# Patient Record
Sex: Female | Born: 1945 | Race: White | Hispanic: No | Marital: Married | Smoking: Never smoker
Health system: Southern US, Community
[De-identification: ages and names within clinical notes are randomized; demographics above are authoritative.]

## PROBLEM LIST (undated history)

## (undated) DIAGNOSIS — I10 Essential (primary) hypertension: Secondary | ICD-10-CM

## (undated) DIAGNOSIS — F410 Panic disorder [episodic paroxysmal anxiety] without agoraphobia: Secondary | ICD-10-CM

---

## 2007-12-02 ENCOUNTER — Encounter: Admission: RE | Admit: 2007-12-02 | Discharge: 2007-12-02 | Payer: Self-pay | Admitting: Family Medicine

## 2008-02-17 ENCOUNTER — Encounter: Admission: RE | Admit: 2008-02-17 | Discharge: 2008-02-17 | Payer: Self-pay | Admitting: Family Medicine

## 2009-05-30 ENCOUNTER — Encounter: Admission: RE | Admit: 2009-05-30 | Discharge: 2009-05-30 | Payer: Self-pay | Admitting: Family Medicine

## 2010-07-04 ENCOUNTER — Other Ambulatory Visit: Payer: Self-pay | Admitting: Family Medicine

## 2010-07-04 DIAGNOSIS — Z1231 Encounter for screening mammogram for malignant neoplasm of breast: Secondary | ICD-10-CM

## 2010-07-04 DIAGNOSIS — Z780 Asymptomatic menopausal state: Secondary | ICD-10-CM

## 2010-07-17 ENCOUNTER — Ambulatory Visit: Payer: Self-pay

## 2010-07-17 ENCOUNTER — Inpatient Hospital Stay: Admission: RE | Admit: 2010-07-17 | Payer: Self-pay | Source: Ambulatory Visit

## 2010-07-27 ENCOUNTER — Ambulatory Visit: Payer: Self-pay

## 2010-07-27 ENCOUNTER — Other Ambulatory Visit: Payer: Self-pay

## 2010-09-12 ENCOUNTER — Ambulatory Visit
Admission: RE | Admit: 2010-09-12 | Discharge: 2010-09-12 | Disposition: A | Discharge status: Home / Self Care | Payer: Medicare Other | Source: Ambulatory Visit | Attending: Family Medicine | Admitting: Family Medicine

## 2010-09-12 DIAGNOSIS — Z780 Asymptomatic menopausal state: Secondary | ICD-10-CM

## 2010-09-12 DIAGNOSIS — Z1231 Encounter for screening mammogram for malignant neoplasm of breast: Secondary | ICD-10-CM

## 2011-10-19 ENCOUNTER — Other Ambulatory Visit: Payer: Self-pay | Admitting: Physician Assistant

## 2011-10-19 DIAGNOSIS — Z1231 Encounter for screening mammogram for malignant neoplasm of breast: Secondary | ICD-10-CM

## 2011-11-16 ENCOUNTER — Ambulatory Visit
Admission: RE | Admit: 2011-11-16 | Discharge: 2011-11-16 | Disposition: A | Discharge status: Home / Self Care | Payer: Medicare Other | Source: Ambulatory Visit | Attending: Physician Assistant | Admitting: Physician Assistant

## 2011-11-16 DIAGNOSIS — Z1231 Encounter for screening mammogram for malignant neoplasm of breast: Secondary | ICD-10-CM

## 2012-10-29 ENCOUNTER — Other Ambulatory Visit: Payer: Self-pay | Admitting: Family Medicine

## 2012-10-29 DIAGNOSIS — Z1231 Encounter for screening mammogram for malignant neoplasm of breast: Secondary | ICD-10-CM

## 2012-10-29 DIAGNOSIS — M810 Age-related osteoporosis without current pathological fracture: Secondary | ICD-10-CM

## 2012-12-09 ENCOUNTER — Ambulatory Visit
Admission: RE | Admit: 2012-12-09 | Discharge: 2012-12-09 | Disposition: A | Discharge status: Home / Self Care | Payer: Medicare Other | Source: Ambulatory Visit | Attending: Family Medicine | Admitting: Family Medicine

## 2012-12-09 DIAGNOSIS — M810 Age-related osteoporosis without current pathological fracture: Secondary | ICD-10-CM

## 2012-12-09 DIAGNOSIS — Z1231 Encounter for screening mammogram for malignant neoplasm of breast: Secondary | ICD-10-CM

## 2013-07-12 ENCOUNTER — Emergency Department (HOSPITAL_COMMUNITY)
Admission: EM | Admit: 2013-07-12 | Discharge: 2013-07-12 | Disposition: A | Discharge status: Home / Self Care | Payer: Medicare HMO | Attending: Emergency Medicine | Admitting: Emergency Medicine

## 2013-07-12 ENCOUNTER — Encounter (HOSPITAL_COMMUNITY): Payer: Self-pay | Admitting: Emergency Medicine

## 2013-07-12 DIAGNOSIS — R42 Dizziness and giddiness: Secondary | ICD-10-CM | POA: Insufficient documentation

## 2013-07-12 DIAGNOSIS — I10 Essential (primary) hypertension: Secondary | ICD-10-CM | POA: Insufficient documentation

## 2013-07-12 DIAGNOSIS — Z8659 Personal history of other mental and behavioral disorders: Secondary | ICD-10-CM | POA: Insufficient documentation

## 2013-07-12 DIAGNOSIS — R55 Syncope and collapse: Secondary | ICD-10-CM

## 2013-07-12 HISTORY — DX: Panic disorder (episodic paroxysmal anxiety): F41.0

## 2013-07-12 HISTORY — DX: Essential (primary) hypertension: I10

## 2013-07-12 LAB — BASIC METABOLIC PANEL
BUN: 13 mg/dL (ref 6–23)
CHLORIDE: 101 meq/L (ref 96–112)
CO2: 25 mEq/L (ref 19–32)
Calcium: 9.3 mg/dL (ref 8.4–10.5)
Creatinine, Ser: 0.64 mg/dL (ref 0.50–1.10)
GFR calc Af Amer: >90 mL/min (ref >90)
GFR calc non Af Amer: 90 mL/min — ABNORMAL LOW (ref >90)
GLUCOSE: 136 mg/dL — AB (ref 70–99)
POTASSIUM: 3.6 meq/L — AB (ref 3.7–5.3)
SODIUM: 140 meq/L (ref 137–147)

## 2013-07-12 LAB — CBC
HCT: 36.3 % (ref 36.0–46.0)
Hemoglobin: 12.4 g/dL (ref 12.0–15.0)
MCH: 31.5 pg (ref 26.0–34.0)
MCHC: 34.2 g/dL (ref 30.0–36.0)
MCV: 92.1 fL (ref 78.0–100.0)
Platelets: 235 10*3/uL (ref 150–400)
RBC: 3.94 MIL/uL (ref 3.87–5.11)
RDW: 12.5 % (ref 11.5–15.5)
WBC: 6.6 10*3/uL (ref 4.0–10.5)

## 2013-07-12 NOTE — ED Notes (Signed)
Pt was on nursing floor visiting family member get dressing changes. Became weak, sweaty and hypotensive during this time. Pt states BP was 67/49 when taken by floor nurse. Pt denies any pain during episode. Pt states that she is feeling better now.

## 2013-07-12 NOTE — Discharge Instructions (Signed)
Near-Syncope °Near-syncope (commonly known as near fainting) is sudden weakness, dizziness, or feeling like you might pass out. During an episode of near-syncope, you may also develop pale skin, have tunnel vision, or feel sick to your stomach (nauseous). Near-syncope may occur when getting up after sitting or while standing for a long time. It is caused by a sudden decrease in blood flow to the brain. This decrease can result from various causes or triggers, most of which are not serious. However, because near-syncope can sometimes be a sign of something serious, a medical evaluation is required. The specific cause is often not determined. °HOME CARE INSTRUCTIONS  °Monitor your condition for any changes. The following actions may help to alleviate any discomfort you are experiencing: °· Have someone stay with you until you feel stable. °· Lie down right away if you start feeling like you might faint. Breathe deeply and steadily. Wait until all the symptoms have passed. Most of these episodes last only a few minutes. You may feel tired for several hours.   °· Drink enough fluids to keep your urine clear or pale yellow.   °· If you are taking blood pressure or heart medicine, get up slowly when seated or lying down. Take several minutes to sit and then stand. This can reduce dizziness. °· Follow up with your health care provider as directed.  °SEEK IMMEDIATE MEDICAL CARE IF:  °· You have a severe headache.   °· You have unusual pain in the chest, abdomen, or back.   °· You are bleeding from the mouth or rectum, or you have black or tarry stool.   °· You have an irregular or very fast heartbeat.   °· You have repeated fainting or have seizure-like jerking during an episode.   °· You faint when sitting or lying down.   °· You have confusion.   °· You have difficulty walking.   °· You have severe weakness.   °· You have vision problems.   °MAKE SURE YOU:  °· Understand these instructions. °· Will watch your  condition. °· Will get help right away if you are not doing well or get worse. °Document Released: 01/29/2005 Document Revised: 10/01/2012 Document Reviewed: 07/04/2012 °[employer]® Patient Information ©2014 [employer]. ° °

## 2013-07-12 NOTE — ED Provider Notes (Signed)
CSN: [id number]     Arrival date & time 07/12/13  1523 History   First MD Initiated Contact with Patient 07/12/13 1536     Chief Complaint  Patient presents with  . Near Syncope     (Consider location/radiation/quality/duration/timing/severity/associated sxs/prior Treatment) HPI 68 year old female presents after near-syncope. She was in this hospital on the floor visiting her husband in the nurse was showing her how to change his buttocks dressing. Her husband had a cyst removed per her. She is watching the dressing change and issues until the nurse put packing in and cut the packing. She then felt acutely lightheaded and felt the room is getting dark. She thought she was going to pass out but did not actually pass out. Felt better after laying flat. Overall her symptoms last approximately 5-10 minutes. As they did not immediately improve, they took her BP and noted it to be around 70 systolic. Denies any chest pain, headaches, palpitations, or shortness of breath. She feels normal now.   Past Medical History  Diagnosis Date  . Hypertension   . Panic attacks    History reviewed. No pertinent past surgical history. History reviewed. No pertinent family history. History  Substance Use Topics  . Smoking status: Never Smoker   . Smokeless tobacco: Not on file  . Alcohol Use: No   OB History   Grav Para Term Preterm Abortions TAB SAB Ect Mult Living                 Review of Systems  Constitutional: Negative for fever.  Eyes: Negative for visual disturbance.  Respiratory: Negative for shortness of breath.   Cardiovascular: Negative for chest pain.  Gastrointestinal: Negative for nausea, vomiting and abdominal pain.  Neurological: Positive for light-headedness. Negative for syncope, weakness, numbness and headaches.  All other systems reviewed and are negative.     Allergies  Review of patient's allergies indicates not on file.  Home Medications   Prior to Admission  medications   Not on File   BP 116/59  Pulse 70  Temp(Src) 98.4 F (36.9 C) (Oral)  Resp 20  SpO2 95% Physical Exam  Nursing note and vitals reviewed. Constitutional: She is oriented to person, place, and time. She appears well-developed and well-nourished. No distress.  HENT:  Head: Normocephalic and atraumatic.  Right Ear: External ear normal.  Left Ear: External ear normal.  Nose: Nose normal.  Eyes: EOM are normal. Pupils are equal, round, and reactive to light. Right eye exhibits no discharge. Left eye exhibits no discharge.  Cardiovascular: Normal rate, regular rhythm and normal heart sounds.   No murmur heard. Pulmonary/Chest: Effort normal and breath sounds normal.  Abdominal: Soft. She exhibits no distension. There is no tenderness.  Neurological: She is alert and oriented to person, place, and time. GCS eye subscore is 4. GCS verbal subscore is 5. GCS motor subscore is 6.  Reflex Scores:      Bicep reflexes are 2+ on the right side and 2+ on the left side.      Patellar reflexes are 2+ on the right side and 2+ on the left side. CN 2-12 grossly intact. 5/5 strength in all 4 extremities.  Skin: Skin is warm and dry.    ED Course  Procedures (including critical care time) Labs Review Labs Reviewed  BASIC METABOLIC PANEL - Abnormal; Notable for the following:    Potassium 3.6 (*)    Glucose, Bld 136 (*)    GFR calc non Af Amer 90 (*)  All other components within normal limits  CBC    Imaging Review No results found.   EKG Interpretation   Date/Time:  Sunday Jul 12 2013 15:30:58 EDT Ventricular Rate:  61 PR Interval:  163 QRS Duration: 93 QT Interval:  409 QTC Calculation: 412 R Axis:   22 Text Interpretation:  Sinus rhythm no acute ishcemia Baseline wander in  lead(s) V5 V6 No old tracing to compare Confirmed by Jedi Catalfamo  MD, Jalene Demo  ([id number]) on 07/12/2013 3:35:53 PM      MDM   Final diagnoses:  Vasovagal near syncope    Patient symptoms are most  consistent with vasovagal near-syncope given that it occurred while watching a nurse pack a wound/change a dressing. EKG and labs are benign. No hypotension in ED, that was likely a part of the vagal response. She feels well now and is stable for discharge.    Audree CamelScott T Myesha Stillion, MD 07/12/13 [id number]

## 2014-07-22 DIAGNOSIS — Z6835 Body mass index (BMI) 35.0-35.9, adult: Secondary | ICD-10-CM | POA: Diagnosis not present

## 2014-07-22 DIAGNOSIS — E559 Vitamin D deficiency, unspecified: Secondary | ICD-10-CM | POA: Diagnosis not present

## 2014-07-22 DIAGNOSIS — G2581 Restless legs syndrome: Secondary | ICD-10-CM | POA: Diagnosis not present

## 2014-07-22 DIAGNOSIS — I10 Essential (primary) hypertension: Secondary | ICD-10-CM | POA: Diagnosis not present

## 2014-07-22 DIAGNOSIS — F419 Anxiety disorder, unspecified: Secondary | ICD-10-CM | POA: Diagnosis not present

## 2014-07-22 DIAGNOSIS — E780 Pure hypercholesterolemia: Secondary | ICD-10-CM | POA: Diagnosis not present

## 2014-07-22 DIAGNOSIS — Z0000 Encounter for general adult medical examination without abnormal findings: Secondary | ICD-10-CM | POA: Diagnosis not present

## 2014-07-22 DIAGNOSIS — G250 Essential tremor: Secondary | ICD-10-CM | POA: Diagnosis not present

## 2014-12-21 ENCOUNTER — Other Ambulatory Visit: Payer: Self-pay | Admitting: Physician Assistant

## 2014-12-21 DIAGNOSIS — M810 Age-related osteoporosis without current pathological fracture: Secondary | ICD-10-CM

## 2014-12-21 DIAGNOSIS — Z1231 Encounter for screening mammogram for malignant neoplasm of breast: Secondary | ICD-10-CM

## 2015-01-13 DIAGNOSIS — M810 Age-related osteoporosis without current pathological fracture: Secondary | ICD-10-CM | POA: Diagnosis not present

## 2015-01-13 DIAGNOSIS — N3946 Mixed incontinence: Secondary | ICD-10-CM | POA: Diagnosis not present

## 2015-01-13 DIAGNOSIS — Z139 Encounter for screening, unspecified: Secondary | ICD-10-CM | POA: Diagnosis not present

## 2015-01-13 DIAGNOSIS — I10 Essential (primary) hypertension: Secondary | ICD-10-CM | POA: Diagnosis not present

## 2015-01-13 DIAGNOSIS — Z23 Encounter for immunization: Secondary | ICD-10-CM | POA: Diagnosis not present

## 2015-01-13 DIAGNOSIS — F419 Anxiety disorder, unspecified: Secondary | ICD-10-CM | POA: Diagnosis not present

## 2015-01-21 ENCOUNTER — Ambulatory Visit
Admission: RE | Admit: 2015-01-21 | Discharge: 2015-01-21 | Disposition: A | Discharge status: Home / Self Care | Payer: Commercial Managed Care - HMO | Source: Ambulatory Visit | Attending: Physician Assistant | Admitting: Physician Assistant

## 2015-01-21 DIAGNOSIS — M810 Age-related osteoporosis without current pathological fracture: Secondary | ICD-10-CM | POA: Diagnosis not present

## 2015-01-21 DIAGNOSIS — Z1231 Encounter for screening mammogram for malignant neoplasm of breast: Secondary | ICD-10-CM

## 2015-07-18 DIAGNOSIS — E7800 Pure hypercholesterolemia, unspecified: Secondary | ICD-10-CM | POA: Diagnosis not present

## 2015-07-18 DIAGNOSIS — E559 Vitamin D deficiency, unspecified: Secondary | ICD-10-CM | POA: Diagnosis not present

## 2015-07-18 DIAGNOSIS — Z1389 Encounter for screening for other disorder: Secondary | ICD-10-CM | POA: Diagnosis not present

## 2015-07-18 DIAGNOSIS — Z0000 Encounter for general adult medical examination without abnormal findings: Secondary | ICD-10-CM | POA: Diagnosis not present

## 2015-07-18 DIAGNOSIS — Z6834 Body mass index (BMI) 34.0-34.9, adult: Secondary | ICD-10-CM | POA: Diagnosis not present

## 2015-07-18 DIAGNOSIS — M810 Age-related osteoporosis without current pathological fracture: Secondary | ICD-10-CM | POA: Diagnosis not present

## 2015-07-18 DIAGNOSIS — E669 Obesity, unspecified: Secondary | ICD-10-CM | POA: Diagnosis not present

## 2015-07-18 DIAGNOSIS — I10 Essential (primary) hypertension: Secondary | ICD-10-CM | POA: Diagnosis not present

## 2015-07-18 DIAGNOSIS — F419 Anxiety disorder, unspecified: Secondary | ICD-10-CM | POA: Diagnosis not present

## 2015-08-10 DIAGNOSIS — H25811 Combined forms of age-related cataract, right eye: Secondary | ICD-10-CM | POA: Diagnosis not present

## 2015-08-10 DIAGNOSIS — H2511 Age-related nuclear cataract, right eye: Secondary | ICD-10-CM | POA: Diagnosis not present

## 2015-08-10 DIAGNOSIS — H40053 Ocular hypertension, bilateral: Secondary | ICD-10-CM | POA: Diagnosis not present

## 2015-08-10 DIAGNOSIS — H35372 Puckering of macula, left eye: Secondary | ICD-10-CM | POA: Diagnosis not present

## 2015-08-10 DIAGNOSIS — H26492 Other secondary cataract, left eye: Secondary | ICD-10-CM | POA: Diagnosis not present

## 2015-08-10 DIAGNOSIS — Z961 Presence of intraocular lens: Secondary | ICD-10-CM | POA: Diagnosis not present

## 2015-08-29 DIAGNOSIS — H2511 Age-related nuclear cataract, right eye: Secondary | ICD-10-CM | POA: Diagnosis not present

## 2015-08-29 DIAGNOSIS — H25811 Combined forms of age-related cataract, right eye: Secondary | ICD-10-CM | POA: Diagnosis not present

## 2015-12-21 DIAGNOSIS — Z23 Encounter for immunization: Secondary | ICD-10-CM | POA: Diagnosis not present

## 2016-01-17 DIAGNOSIS — E7800 Pure hypercholesterolemia, unspecified: Secondary | ICD-10-CM | POA: Diagnosis not present

## 2016-01-17 DIAGNOSIS — Z9181 History of falling: Secondary | ICD-10-CM | POA: Diagnosis not present

## 2016-01-17 DIAGNOSIS — F419 Anxiety disorder, unspecified: Secondary | ICD-10-CM | POA: Diagnosis not present

## 2016-01-17 DIAGNOSIS — I10 Essential (primary) hypertension: Secondary | ICD-10-CM | POA: Diagnosis not present

## 2016-07-16 ENCOUNTER — Other Ambulatory Visit: Payer: Self-pay | Admitting: Physician Assistant

## 2016-07-16 DIAGNOSIS — Z1231 Encounter for screening mammogram for malignant neoplasm of breast: Secondary | ICD-10-CM

## 2016-07-18 ENCOUNTER — Other Ambulatory Visit: Payer: Self-pay | Admitting: Physician Assistant

## 2016-07-18 DIAGNOSIS — Z9181 History of falling: Secondary | ICD-10-CM | POA: Diagnosis not present

## 2016-07-18 DIAGNOSIS — I10 Essential (primary) hypertension: Secondary | ICD-10-CM | POA: Diagnosis not present

## 2016-07-18 DIAGNOSIS — M810 Age-related osteoporosis without current pathological fracture: Secondary | ICD-10-CM

## 2016-07-18 DIAGNOSIS — E7800 Pure hypercholesterolemia, unspecified: Secondary | ICD-10-CM | POA: Diagnosis not present

## 2016-07-18 DIAGNOSIS — F419 Anxiety disorder, unspecified: Secondary | ICD-10-CM | POA: Diagnosis not present

## 2016-07-18 DIAGNOSIS — Z139 Encounter for screening, unspecified: Secondary | ICD-10-CM | POA: Diagnosis not present

## 2016-08-01 ENCOUNTER — Ambulatory Visit: Payer: Commercial Managed Care - HMO

## 2016-11-08 DIAGNOSIS — Z23 Encounter for immunization: Secondary | ICD-10-CM | POA: Diagnosis not present

## 2016-12-12 DIAGNOSIS — Z961 Presence of intraocular lens: Secondary | ICD-10-CM | POA: Diagnosis not present

## 2016-12-12 DIAGNOSIS — H35373 Puckering of macula, bilateral: Secondary | ICD-10-CM | POA: Diagnosis not present

## 2017-01-09 DIAGNOSIS — F419 Anxiety disorder, unspecified: Secondary | ICD-10-CM | POA: Diagnosis not present

## 2017-01-09 DIAGNOSIS — Z6833 Body mass index (BMI) 33.0-33.9, adult: Secondary | ICD-10-CM | POA: Diagnosis not present

## 2017-01-09 DIAGNOSIS — E7800 Pure hypercholesterolemia, unspecified: Secondary | ICD-10-CM | POA: Diagnosis not present

## 2017-01-09 DIAGNOSIS — I10 Essential (primary) hypertension: Secondary | ICD-10-CM | POA: Diagnosis not present

## 2017-01-17 DIAGNOSIS — Z1211 Encounter for screening for malignant neoplasm of colon: Secondary | ICD-10-CM | POA: Diagnosis not present

## 2017-01-17 DIAGNOSIS — Z6833 Body mass index (BMI) 33.0-33.9, adult: Secondary | ICD-10-CM | POA: Diagnosis not present

## 2017-01-17 DIAGNOSIS — Z136 Encounter for screening for cardiovascular disorders: Secondary | ICD-10-CM | POA: Diagnosis not present

## 2017-01-17 DIAGNOSIS — Z0000 Encounter for general adult medical examination without abnormal findings: Secondary | ICD-10-CM | POA: Diagnosis not present

## 2017-01-17 DIAGNOSIS — E785 Hyperlipidemia, unspecified: Secondary | ICD-10-CM | POA: Diagnosis not present

## 2017-01-17 DIAGNOSIS — E669 Obesity, unspecified: Secondary | ICD-10-CM | POA: Diagnosis not present

## 2017-02-13 ENCOUNTER — Ambulatory Visit
Admission: RE | Admit: 2017-02-13 | Discharge: 2017-02-13 | Disposition: A | Discharge status: Home / Self Care | Payer: Medicare HMO | Source: Ambulatory Visit | Attending: Physician Assistant | Admitting: Physician Assistant

## 2017-02-13 DIAGNOSIS — M810 Age-related osteoporosis without current pathological fracture: Secondary | ICD-10-CM

## 2017-02-13 DIAGNOSIS — Z780 Asymptomatic menopausal state: Secondary | ICD-10-CM | POA: Diagnosis not present

## 2017-02-13 DIAGNOSIS — Z1231 Encounter for screening mammogram for malignant neoplasm of breast: Secondary | ICD-10-CM

## 2017-07-10 DIAGNOSIS — Z6832 Body mass index (BMI) 32.0-32.9, adult: Secondary | ICD-10-CM | POA: Diagnosis not present

## 2017-07-10 DIAGNOSIS — I10 Essential (primary) hypertension: Secondary | ICD-10-CM | POA: Diagnosis not present

## 2017-07-10 DIAGNOSIS — E7800 Pure hypercholesterolemia, unspecified: Secondary | ICD-10-CM | POA: Diagnosis not present

## 2017-07-10 DIAGNOSIS — M810 Age-related osteoporosis without current pathological fracture: Secondary | ICD-10-CM | POA: Diagnosis not present

## 2017-07-10 DIAGNOSIS — F419 Anxiety disorder, unspecified: Secondary | ICD-10-CM | POA: Diagnosis not present

## 2017-12-12 DIAGNOSIS — Z961 Presence of intraocular lens: Secondary | ICD-10-CM | POA: Diagnosis not present

## 2017-12-12 DIAGNOSIS — H35373 Puckering of macula, bilateral: Secondary | ICD-10-CM | POA: Diagnosis not present

## 2017-12-12 DIAGNOSIS — H04123 Dry eye syndrome of bilateral lacrimal glands: Secondary | ICD-10-CM | POA: Diagnosis not present

## 2018-01-13 DIAGNOSIS — F419 Anxiety disorder, unspecified: Secondary | ICD-10-CM | POA: Diagnosis not present

## 2018-01-13 DIAGNOSIS — E7800 Pure hypercholesterolemia, unspecified: Secondary | ICD-10-CM | POA: Diagnosis not present

## 2018-01-13 DIAGNOSIS — Z9181 History of falling: Secondary | ICD-10-CM | POA: Diagnosis not present

## 2018-01-13 DIAGNOSIS — Z139 Encounter for screening, unspecified: Secondary | ICD-10-CM | POA: Diagnosis not present

## 2018-01-13 DIAGNOSIS — I10 Essential (primary) hypertension: Secondary | ICD-10-CM | POA: Diagnosis not present

## 2018-01-13 DIAGNOSIS — Z1331 Encounter for screening for depression: Secondary | ICD-10-CM | POA: Diagnosis not present

## 2018-01-13 DIAGNOSIS — Z1231 Encounter for screening mammogram for malignant neoplasm of breast: Secondary | ICD-10-CM | POA: Diagnosis not present

## 2018-01-13 DIAGNOSIS — M810 Age-related osteoporosis without current pathological fracture: Secondary | ICD-10-CM | POA: Diagnosis not present

## 2018-01-14 ENCOUNTER — Other Ambulatory Visit: Payer: Self-pay | Admitting: Physician Assistant

## 2018-01-14 DIAGNOSIS — Z1231 Encounter for screening mammogram for malignant neoplasm of breast: Secondary | ICD-10-CM

## 2018-01-20 DIAGNOSIS — Z139 Encounter for screening, unspecified: Secondary | ICD-10-CM | POA: Diagnosis not present

## 2018-01-20 DIAGNOSIS — Z136 Encounter for screening for cardiovascular disorders: Secondary | ICD-10-CM | POA: Diagnosis not present

## 2018-01-20 DIAGNOSIS — Z0000 Encounter for general adult medical examination without abnormal findings: Secondary | ICD-10-CM | POA: Diagnosis not present

## 2018-01-20 DIAGNOSIS — E785 Hyperlipidemia, unspecified: Secondary | ICD-10-CM | POA: Diagnosis not present

## 2018-01-20 DIAGNOSIS — Z6832 Body mass index (BMI) 32.0-32.9, adult: Secondary | ICD-10-CM | POA: Diagnosis not present

## 2018-01-20 DIAGNOSIS — E669 Obesity, unspecified: Secondary | ICD-10-CM | POA: Diagnosis not present

## 2018-02-24 ENCOUNTER — Ambulatory Visit
Admission: RE | Admit: 2018-02-24 | Discharge: 2018-02-24 | Disposition: A | Discharge status: Home / Self Care | Payer: Medicare HMO | Source: Ambulatory Visit | Attending: Physician Assistant | Admitting: Physician Assistant

## 2018-02-24 DIAGNOSIS — Z1231 Encounter for screening mammogram for malignant neoplasm of breast: Secondary | ICD-10-CM | POA: Diagnosis not present

## 2018-07-15 DIAGNOSIS — I10 Essential (primary) hypertension: Secondary | ICD-10-CM | POA: Diagnosis not present

## 2018-07-15 DIAGNOSIS — E7800 Pure hypercholesterolemia, unspecified: Secondary | ICD-10-CM | POA: Diagnosis not present

## 2018-07-15 DIAGNOSIS — Z6834 Body mass index (BMI) 34.0-34.9, adult: Secondary | ICD-10-CM | POA: Diagnosis not present

## 2018-07-15 DIAGNOSIS — M810 Age-related osteoporosis without current pathological fracture: Secondary | ICD-10-CM | POA: Diagnosis not present

## 2018-07-15 DIAGNOSIS — F419 Anxiety disorder, unspecified: Secondary | ICD-10-CM | POA: Diagnosis not present

## 2019-03-31 DIAGNOSIS — Z6833 Body mass index (BMI) 33.0-33.9, adult: Secondary | ICD-10-CM | POA: Diagnosis not present

## 2019-03-31 DIAGNOSIS — R7303 Prediabetes: Secondary | ICD-10-CM | POA: Diagnosis not present

## 2019-03-31 DIAGNOSIS — R69 Illness, unspecified: Secondary | ICD-10-CM | POA: Diagnosis not present

## 2019-04-13 DIAGNOSIS — Z20822 Contact with and (suspected) exposure to covid-19: Secondary | ICD-10-CM | POA: Diagnosis not present

## 2019-06-11 DIAGNOSIS — E785 Hyperlipidemia, unspecified: Secondary | ICD-10-CM | POA: Diagnosis not present

## 2019-06-11 DIAGNOSIS — Z8249 Family history of ischemic heart disease and other diseases of the circulatory system: Secondary | ICD-10-CM | POA: Diagnosis not present

## 2019-06-11 DIAGNOSIS — I10 Essential (primary) hypertension: Secondary | ICD-10-CM | POA: Diagnosis not present

## 2019-06-11 DIAGNOSIS — Z791 Long term (current) use of non-steroidal anti-inflammatories (NSAID): Secondary | ICD-10-CM | POA: Diagnosis not present

## 2019-06-11 DIAGNOSIS — E669 Obesity, unspecified: Secondary | ICD-10-CM | POA: Diagnosis not present

## 2019-06-11 DIAGNOSIS — R69 Illness, unspecified: Secondary | ICD-10-CM | POA: Diagnosis not present

## 2019-06-11 DIAGNOSIS — Z7722 Contact with and (suspected) exposure to environmental tobacco smoke (acute) (chronic): Secondary | ICD-10-CM | POA: Diagnosis not present

## 2019-06-11 DIAGNOSIS — R32 Unspecified urinary incontinence: Secondary | ICD-10-CM | POA: Diagnosis not present

## 2019-09-04 ENCOUNTER — Emergency Department (HOSPITAL_COMMUNITY): Payer: Medicare HMO

## 2019-09-04 ENCOUNTER — Encounter (HOSPITAL_COMMUNITY): Payer: Self-pay

## 2019-09-04 ENCOUNTER — Observation Stay (HOSPITAL_COMMUNITY)
Admission: EM | Admit: 2019-09-04 | Discharge: 2019-09-06 | Disposition: A | Discharge status: Home / Self Care | Payer: Medicare HMO | Attending: Internal Medicine | Admitting: Internal Medicine

## 2019-09-04 DIAGNOSIS — Y939 Activity, unspecified: Secondary | ICD-10-CM | POA: Insufficient documentation

## 2019-09-04 DIAGNOSIS — Y92009 Unspecified place in unspecified non-institutional (private) residence as the place of occurrence of the external cause: Secondary | ICD-10-CM | POA: Diagnosis not present

## 2019-09-04 DIAGNOSIS — Y999 Unspecified external cause status: Secondary | ICD-10-CM | POA: Diagnosis not present

## 2019-09-04 DIAGNOSIS — W1809XA Striking against other object with subsequent fall, initial encounter: Secondary | ICD-10-CM | POA: Insufficient documentation

## 2019-09-04 DIAGNOSIS — Z79899 Other long term (current) drug therapy: Secondary | ICD-10-CM | POA: Diagnosis not present

## 2019-09-04 DIAGNOSIS — R55 Syncope and collapse: Secondary | ICD-10-CM | POA: Insufficient documentation

## 2019-09-04 DIAGNOSIS — E041 Nontoxic single thyroid nodule: Secondary | ICD-10-CM

## 2019-09-04 DIAGNOSIS — E785 Hyperlipidemia, unspecified: Secondary | ICD-10-CM

## 2019-09-04 DIAGNOSIS — E049 Nontoxic goiter, unspecified: Secondary | ICD-10-CM

## 2019-09-04 DIAGNOSIS — W19XXXA Unspecified fall, initial encounter: Secondary | ICD-10-CM | POA: Diagnosis not present

## 2019-09-04 DIAGNOSIS — I10 Essential (primary) hypertension: Secondary | ICD-10-CM | POA: Insufficient documentation

## 2019-09-04 DIAGNOSIS — R296 Repeated falls: Secondary | ICD-10-CM | POA: Diagnosis not present

## 2019-09-04 DIAGNOSIS — R58 Hemorrhage, not elsewhere classified: Secondary | ICD-10-CM | POA: Diagnosis not present

## 2019-09-04 DIAGNOSIS — Z20822 Contact with and (suspected) exposure to covid-19: Secondary | ICD-10-CM | POA: Insufficient documentation

## 2019-09-04 DIAGNOSIS — R42 Dizziness and giddiness: Secondary | ICD-10-CM | POA: Diagnosis not present

## 2019-09-04 DIAGNOSIS — J90 Pleural effusion, not elsewhere classified: Secondary | ICD-10-CM | POA: Diagnosis not present

## 2019-09-04 DIAGNOSIS — S0101XA Laceration without foreign body of scalp, initial encounter: Principal | ICD-10-CM | POA: Insufficient documentation

## 2019-09-04 LAB — COMPREHENSIVE METABOLIC PANEL
ALT: 18 U/L (ref 0–44)
AST: 23 U/L (ref 15–41)
Albumin: 4.2 g/dL (ref 3.5–5.0)
Alkaline Phosphatase: 58 U/L (ref 38–126)
Anion gap: 12 (ref 5–15)
BUN: 18 mg/dL (ref 8–23)
CO2: 24 mmol/L (ref 22–32)
Calcium: 9.4 mg/dL (ref 8.9–10.3)
Chloride: 103 mmol/L (ref 98–111)
Creatinine, Ser: 0.79 mg/dL (ref 0.44–1.00)
GFR calc Af Amer: >60 mL/min (ref >60)
GFR calc non Af Amer: >60 mL/min (ref >60)
Glucose, Bld: 101 mg/dL — ABNORMAL HIGH (ref 70–99)
Potassium: 3.6 mmol/L (ref 3.5–5.1)
Sodium: 139 mmol/L (ref 135–145)
Total Bilirubin: 0.5 mg/dL (ref 0.3–1.2)
Total Protein: 7.3 g/dL (ref 6.5–8.1)

## 2019-09-04 LAB — CBC
HCT: 34.8 % — ABNORMAL LOW (ref 36.0–46.0)
Hemoglobin: 11.1 g/dL — ABNORMAL LOW (ref 12.0–15.0)
MCH: 30.5 pg (ref 26.0–34.0)
MCHC: 31.9 g/dL (ref 30.0–36.0)
MCV: 95.6 fL (ref 80.0–100.0)
Platelets: 262 10*3/uL (ref 150–400)
RBC: 3.64 MIL/uL — ABNORMAL LOW (ref 3.87–5.11)
RDW: 13 % (ref 11.5–15.5)
WBC: 8.7 10*3/uL (ref 4.0–10.5)
nRBC: 0 % (ref 0.0–0.2)

## 2019-09-04 LAB — CBG MONITORING, ED: Glucose-Capillary: 85 mg/dL (ref 70–99)

## 2019-09-04 LAB — TROPONIN I (HIGH SENSITIVITY): Troponin I (High Sensitivity): 6 ng/L (ref <18)

## 2019-09-04 LAB — SARS CORONAVIRUS 2 BY RT PCR (HOSPITAL ORDER, PERFORMED IN ~~LOC~~ HOSPITAL LAB): SARS Coronavirus 2: NEGATIVE

## 2019-09-04 MED ORDER — SODIUM CHLORIDE 0.9% FLUSH
3.0000 mL | Freq: Once | INTRAVENOUS | Status: AC
  Administered 2019-09-04: 3 mL via INTRAVENOUS

## 2019-09-04 NOTE — ED Provider Notes (Signed)
[facility] EMERGENCY DEPARTMENT Provider Note   CSN: [id number] Arrival date & time: 09/04/19  1958     History Chief Complaint  Patient presents with  . Loss of Consciousness    Stephanie Vang is a 74 y.o. female.  The history is provided by the patient and medical records. No language interpreter was used.  Loss of Consciousness  Stephanie Vang is a 74 y.o. female who presents to the Emergency Department complaining of syncope. She presents the emergency department by EMS from home for evaluation following a syncopal event. She states that she was ironing when she began to feel the blood rush out of her head and she passed out, falling backwards and striking her head on a TV stand. She did have two syncopal events yesterday, one in the grocery and one at home. The syncopal event at home she did spill some hot food on her leg. She states that the episodes yesterday she had associated vertigo symptoms but today she did not. She denies any fevers, chest pain, shortness of breath, nausea, vomiting, diarrhea, blocker blood he stools. She lives at home with her two sons. She has a history of hypertension, no additional medical problems.    Past Medical History:  Diagnosis Date  . Hypertension   . Panic attacks     There are no problems to display for this patient.   History reviewed. No pertinent surgical history.   OB History   No obstetric history on file.     No family history on file.  Social History   Tobacco Use  . Smoking status: Never Smoker  Substance Use Topics  . Alcohol use: No  . Drug use: No    Home Medications Prior to Admission medications   Medication Sig Start Date End Date Taking? Authorizing Provider  alendronate (FOSAMAX) 70 MG tablet Take by mouth every Saturday. Take with a full glass of water on an empty stomach. Takes on saturdays    [provider]  cholecalciferol (VITAMIN D) 400 UNITS TABS tablet Take 400 Units  by mouth every evening.    [provider]  citalopram (CELEXA) 10 MG tablet Take by mouth daily.    [provider]  fexofenadine (ALLEGRA) 60 MG tablet Take 60 mg by mouth daily as needed for allergies or rhinitis.    [provider]  glucosamine-chondroitin 500-400 MG tablet Take 1 tablet by mouth every evening.    [provider]  lisinopril (PRINIVIL,ZESTRIL) 10 MG tablet Take by mouth daily.    [provider]  Multiple Vitamin (MULTIVITAMIN WITH MINERALS) TABS tablet Take 1 tablet by mouth every evening.    [provider]    Allergies    Biaxin [clarithromycin]  Review of Systems   Review of Systems  Cardiovascular: Positive for syncope.  All other systems reviewed and are negative.   Physical Exam Updated Vital Signs BP (!) 117/59   Pulse 64   Temp 98.3 F (36.8 C) (Oral)   Resp 15   SpO2 100%   Physical Exam Vitals and nursing note reviewed.  Constitutional:      Appearance: She is well-developed.  HENT:     Head: Normocephalic.     Comments: There is a hematoma and 2 centimeter laceration to the right posterior scalp Cardiovascular:     Rate and Rhythm: Normal rate and regular rhythm.     Heart sounds: No murmur heard.   Pulmonary:     Effort:  Pulmonary effort is normal. No respiratory distress.     Breath sounds: Normal breath sounds.  Abdominal:     Palpations: Abdomen is soft.     Tenderness: There is no abdominal tenderness. There is no guarding or rebound.  Musculoskeletal:        General: No tenderness.     Comments: Single blister to the left ankle with mild local erythema  Skin:    General: Skin is warm and dry.  Neurological:     Mental Status: She is alert and oriented to person, place, and time.     Comments: Five out of five strength in all four extremities with sensation light touch intact in all four extremities  Psychiatric:        Behavior: Behavior normal.     ED Results /  Procedures / Treatments   Labs (all labs ordered are listed, but only abnormal results are displayed) Labs Reviewed  COMPREHENSIVE METABOLIC PANEL - Abnormal; Notable for the following components:      Result Value   Glucose, Bld 101 (*)    All other components within normal limits  CBC - Abnormal; Notable for the following components:   RBC 3.64 (*)    Hemoglobin 11.1 (*)    HCT 34.8 (*)    All other components within normal limits  SARS CORONAVIRUS 2 BY RT PCR (HOSPITAL ORDER, PERFORMED IN [facility])  URINALYSIS, ROUTINE W REFLEX MICROSCOPIC  CBG MONITORING, ED  TROPONIN I (HIGH SENSITIVITY)    EKG EKG Interpretation  Date/Time:  Friday September 04 2019 20:07:52 EDT Ventricular Rate:  64 PR Interval:  150 QRS Duration: 88 QT Interval:  420 QTC Calculation: 433 R Axis:   23 Text Interpretation: Normal sinus rhythm Anterior infarct , age undetermined Abnormal ECG Confirmed by Tilden Fossaees, Dimmit Antolin [phone]([id number]) on 09/04/2019 8:20:20 PM   Radiology DG Chest 2 View  Result Date: 09/04/2019 CLINICAL DATA:  Syncope. EXAM: CHEST - 2 VIEW COMPARISON:  None. FINDINGS: Very mild, diffuse chronic appearing increased lung markings are seen without evidence of acute infiltrate, pleural effusion or pneumothorax. The heart size and mediastinal contours are within normal limits. Multilevel degenerative changes seen throughout the thoracic spine. IMPRESSION: No active cardiopulmonary disease. Electronically Signed   By: Aram Candelahaddeus  Houston M.D.   On: 09/04/2019 21:04   CT Head Wo Contrast  Result Date: 09/04/2019 CLINICAL DATA:  Head trauma. Struck posterior aspect of head on a wooden TV stand. Multiple falls over the last 3 days. Scalp hematoma. EXAM: CT HEAD WITHOUT CONTRAST CT CERVICAL SPINE WITHOUT CONTRAST TECHNIQUE: Multidetector CT imaging of the head and cervical spine was performed following the standard protocol without intravenous contrast. Multiplanar CT image reconstructions of the  cervical spine were also generated. COMPARISON:  None. FINDINGS: CT HEAD FINDINGS Brain: No acute infarct, hemorrhage, or mass lesion is present. Mild atrophy and white matter changes are present. The ventricles are of normal size. No significant extraaxial fluid collection is present. The brainstem and cerebellum are within normal limits. Vascular: Atherosclerotic changes are present in the cavernous internal carotid arteries bilaterally. No hyperdense vessel is present. Skull: Right paramedian scalp laceration and hematoma is present near the vertex. No underlying fracture is present. Sinuses/Orbits: The paranasal sinuses and mastoid air cells are clear. Bilateral lens replacements are noted. Globes and orbits are otherwise unremarkable. CT CERVICAL SPINE FINDINGS Alignment: No significant listhesis is present. Cervical lordosis preserved. Skull base and vertebrae: Craniocervical junction is within normal limits. Vertebral body heights are  normal. No acute or healing fractures are present. Soft tissues and spinal canal: A multinodular goiter of the thyroid is present. No dominant nodule is present. No significant adenopathy is present. Disc levels: Multilevel degenerative changes are present. Moderate right foraminal stenosis is present at C4-5 due to facet and uncovertebral spurring. Upper chest: Lung apices are clear. Thoracic inlet is within normal limits. IMPRESSION: 1. Right paramedian scalp laceration and hematoma near the vertex without underlying fracture. 2. Normal CT appearance of the brain for age. No acute intracranial abnormality. 3. No acute fracture or traumatic subluxation in the cervical spine. 4. Multilevel degenerative changes of the cervical spine as described. 5. Multinodular goiter. Recommend thyroid ultrasound (ref: J Am Coll Radiol. 2015 Feb;12(2): 143-50). Electronically Signed   By: Marin Roberts M.D.   On: 09/04/2019 20:59   CT Cervical Spine Wo Contrast  Result Date:  09/04/2019 CLINICAL DATA:  Head trauma. Struck posterior aspect of head on a wooden TV stand. Multiple falls over the last 3 days. Scalp hematoma. EXAM: CT HEAD WITHOUT CONTRAST CT CERVICAL SPINE WITHOUT CONTRAST TECHNIQUE: Multidetector CT imaging of the head and cervical spine was performed following the standard protocol without intravenous contrast. Multiplanar CT image reconstructions of the cervical spine were also generated. COMPARISON:  None. FINDINGS: CT HEAD FINDINGS Brain: No acute infarct, hemorrhage, or mass lesion is present. Mild atrophy and white matter changes are present. The ventricles are of normal size. No significant extraaxial fluid collection is present. The brainstem and cerebellum are within normal limits. Vascular: Atherosclerotic changes are present in the cavernous internal carotid arteries bilaterally. No hyperdense vessel is present. Skull: Right paramedian scalp laceration and hematoma is present near the vertex. No underlying fracture is present. Sinuses/Orbits: The paranasal sinuses and mastoid air cells are clear. Bilateral lens replacements are noted. Globes and orbits are otherwise unremarkable. CT CERVICAL SPINE FINDINGS Alignment: No significant listhesis is present. Cervical lordosis preserved. Skull base and vertebrae: Craniocervical junction is within normal limits. Vertebral body heights are normal. No acute or healing fractures are present. Soft tissues and spinal canal: A multinodular goiter of the thyroid is present. No dominant nodule is present. No significant adenopathy is present. Disc levels: Multilevel degenerative changes are present. Moderate right foraminal stenosis is present at C4-5 due to facet and uncovertebral spurring. Upper chest: Lung apices are clear. Thoracic inlet is within normal limits. IMPRESSION: 1. Right paramedian scalp laceration and hematoma near the vertex without underlying fracture. 2. Normal CT appearance of the brain for age. No acute  intracranial abnormality. 3. No acute fracture or traumatic subluxation in the cervical spine. 4. Multilevel degenerative changes of the cervical spine as described. 5. Multinodular goiter. Recommend thyroid ultrasound (ref: J Am Coll Radiol. 2015 Feb;12(2): 143-50). Electronically Signed   By: Marin Roberts M.D.   On: 09/04/2019 20:59    Procedures .Marland KitchenLaceration Repair  Date/Time: 09/04/2019 11:46 PM Performed by: Tilden Fossa, MD Authorized by: Tilden Fossa, MD   Consent:    Consent obtained:  Verbal   Consent given by:  Patient   Risks discussed:  Infection and pain Anesthesia (see MAR for exact dosages):    Anesthesia method:  None Laceration details:    Location:  Scalp   Scalp location:  R parietal   Length (cm):  2 Repair type:    Repair type:  Simple Pre-procedure details:    Preparation:  Patient was prepped and draped in usual sterile fashion Exploration:    Hemostasis achieved with:  Direct pressure  Contaminated: no   Treatment:    Area cleansed with:  Saline   Amount of cleaning:  Standard Skin repair:    Repair method:  Staples   Number of staples:  2 Approximation:    Approximation:  Close Post-procedure details:    Dressing:  Open (no dressing)   Patient tolerance of procedure:  Tolerated well, no immediate complications   (including critical care time) Angiocath insertion Performed by: Tilden Fossa  Consent: Verbal consent obtained. Risks and benefits: risks, benefits and alternatives were discussed Time out: Immediately prior to procedure a "time out" was called to verify the correct patient, procedure, equipment, support staff and site/side marked as required.  Preparation: Patient was prepped and draped in the usual sterile fashion.  Vein Location: left AC   Ultrasound Guided - No  Gauge: 20  Normal blood return and flush without difficulty Patient tolerance: Patient tolerated the procedure well with no immediate  complications.    Medications Ordered in ED Medications  sodium chloride flush (NS) 0.9 % injection 3 mL (3 mLs Intravenous Given 09/04/19 2334)    ED Course  I have reviewed the triage vital signs and the nursing notes.  Pertinent labs & imaging results that were available during my care of the patient were reviewed by me and considered in my medical decision making (see chart for details).    MDM Rules/Calculators/A&P                         Patient here for evaluation following multiple syncopal events. She does have a parietal scalp laceration that was repaired per note. Given three separate syncopal events in the last 24 hours recommend observation admission. Patient has been asymptomatic during her ED stay with no focal neurologic deficits. Medicine consulted for admission.  Final Clinical Impression(s) / ED Diagnoses Final diagnoses:  None    Rx / DC Orders ED Discharge Orders    None       Tilden Fossa, MD 09/04/19 2349

## 2019-09-04 NOTE — ED Triage Notes (Signed)
Pt comes from home for syncopal episode, has happened several times in the past few days, laceration to back of head, bleeding controlled, not on thinners.

## 2019-09-05 ENCOUNTER — Other Ambulatory Visit: Payer: Self-pay

## 2019-09-05 ENCOUNTER — Encounter (HOSPITAL_COMMUNITY): Payer: Self-pay | Admitting: Internal Medicine

## 2019-09-05 ENCOUNTER — Observation Stay (HOSPITAL_COMMUNITY): Payer: Medicare HMO

## 2019-09-05 ENCOUNTER — Observation Stay (HOSPITAL_BASED_OUTPATIENT_CLINIC_OR_DEPARTMENT_OTHER): Payer: Medicare HMO

## 2019-09-05 DIAGNOSIS — E042 Nontoxic multinodular goiter: Secondary | ICD-10-CM | POA: Diagnosis not present

## 2019-09-05 DIAGNOSIS — E041 Nontoxic single thyroid nodule: Secondary | ICD-10-CM | POA: Diagnosis not present

## 2019-09-05 DIAGNOSIS — S0101XA Laceration without foreign body of scalp, initial encounter: Secondary | ICD-10-CM | POA: Diagnosis not present

## 2019-09-05 DIAGNOSIS — E079 Disorder of thyroid, unspecified: Secondary | ICD-10-CM | POA: Diagnosis not present

## 2019-09-05 DIAGNOSIS — R55 Syncope and collapse: Secondary | ICD-10-CM | POA: Diagnosis present

## 2019-09-05 DIAGNOSIS — I10 Essential (primary) hypertension: Secondary | ICD-10-CM | POA: Diagnosis not present

## 2019-09-05 DIAGNOSIS — E785 Hyperlipidemia, unspecified: Secondary | ICD-10-CM

## 2019-09-05 DIAGNOSIS — I361 Nonrheumatic tricuspid (valve) insufficiency: Secondary | ICD-10-CM | POA: Diagnosis not present

## 2019-09-05 DIAGNOSIS — E049 Nontoxic goiter, unspecified: Secondary | ICD-10-CM | POA: Diagnosis present

## 2019-09-05 LAB — CBG MONITORING, ED: Glucose-Capillary: 90 mg/dL (ref 70–99)

## 2019-09-05 LAB — ECHOCARDIOGRAM COMPLETE
AR max vel: 2.17 cm2
AV Area VTI: 2.54 cm2
AV Area mean vel: 2.15 cm2
AV Mean grad: 3 mmHg
AV Peak grad: 6.6 mmHg
Ao pk vel: 1.28 m/s
Area-P 1/2: 5.38 cm2
Height: 61 in
S' Lateral: 2.3 cm
Weight: 2592 oz

## 2019-09-05 LAB — URINALYSIS, ROUTINE W REFLEX MICROSCOPIC
Bilirubin Urine: NEGATIVE
Glucose, UA: NEGATIVE mg/dL
Hgb urine dipstick: NEGATIVE
Ketones, ur: NEGATIVE mg/dL
Leukocytes,Ua: NEGATIVE
Nitrite: NEGATIVE
Protein, ur: NEGATIVE mg/dL
Specific Gravity, Urine: 1.014 (ref 1.005–1.030)
pH: 6 (ref 5.0–8.0)

## 2019-09-05 LAB — FOLATE: Folate: 34 ng/mL (ref >5.9)

## 2019-09-05 LAB — CBC
HCT: 32.3 % — ABNORMAL LOW (ref 36.0–46.0)
Hemoglobin: 10.3 g/dL — ABNORMAL LOW (ref 12.0–15.0)
MCH: 29.9 pg (ref 26.0–34.0)
MCHC: 31.9 g/dL (ref 30.0–36.0)
MCV: 93.9 fL (ref 80.0–100.0)
Platelets: 247 10*3/uL (ref 150–400)
RBC: 3.44 MIL/uL — ABNORMAL LOW (ref 3.87–5.11)
RDW: 13 % (ref 11.5–15.5)
WBC: 8.8 10*3/uL (ref 4.0–10.5)
nRBC: 0 % (ref 0.0–0.2)

## 2019-09-05 LAB — BASIC METABOLIC PANEL
Anion gap: 9 (ref 5–15)
BUN: 17 mg/dL (ref 8–23)
CO2: 27 mmol/L (ref 22–32)
Calcium: 9.2 mg/dL (ref 8.9–10.3)
Chloride: 104 mmol/L (ref 98–111)
Creatinine, Ser: 0.72 mg/dL (ref 0.44–1.00)
GFR calc Af Amer: >60 mL/min (ref >60)
GFR calc non Af Amer: >60 mL/min (ref >60)
Glucose, Bld: 109 mg/dL — ABNORMAL HIGH (ref 70–99)
Potassium: 3.4 mmol/L — ABNORMAL LOW (ref 3.5–5.1)
Sodium: 140 mmol/L (ref 135–145)

## 2019-09-05 LAB — IRON AND TIBC
Iron: 44 ug/dL (ref 28–170)
Saturation Ratios: 12 % (ref 10.4–31.8)
TIBC: 358 ug/dL (ref 250–450)
UIBC: 314 ug/dL

## 2019-09-05 LAB — HEMOGLOBIN AND HEMATOCRIT, BLOOD
HCT: 32.8 % — ABNORMAL LOW (ref 36.0–46.0)
Hemoglobin: 10.6 g/dL — ABNORMAL LOW (ref 12.0–15.0)

## 2019-09-05 LAB — FERRITIN: Ferritin: 42 ng/mL (ref 11–307)

## 2019-09-05 LAB — TSH: TSH: 1.977 u[IU]/mL (ref 0.350–4.500)

## 2019-09-05 LAB — VITAMIN B12: Vitamin B-12: 433 pg/mL (ref 180–914)

## 2019-09-05 MED ORDER — PERFLUTREN LIPID MICROSPHERE
1.0000 mL | INTRAVENOUS | Status: AC | PRN
  Administered 2019-09-05: 3 mL via INTRAVENOUS
  Filled 2019-09-05: qty 10

## 2019-09-05 MED ORDER — ONDANSETRON HCL 4 MG/2ML IJ SOLN: 4.0000 mg | Freq: Four times a day (QID) | INTRAMUSCULAR | Status: DC | PRN

## 2019-09-05 MED ORDER — LISINOPRIL-HYDROCHLOROTHIAZIDE 20-12.5 MG PO TABS: 1.0000 | ORAL_TABLET | Freq: Two times a day (BID) | ORAL | Status: DC

## 2019-09-05 MED ORDER — ESCITALOPRAM OXALATE 10 MG PO TABS
20.0000 mg | ORAL_TABLET | Freq: Every day | ORAL | Status: DC
  Administered 2019-09-05 – 2019-09-06 (×2): 20 mg via ORAL
  Filled 2019-09-05 (×2): qty 2

## 2019-09-05 MED ORDER — ATORVASTATIN CALCIUM 10 MG PO TABS
20.0000 mg | ORAL_TABLET | Freq: Every evening | ORAL | Status: DC
  Administered 2019-09-05: 20 mg via ORAL
  Filled 2019-09-05: qty 2

## 2019-09-05 MED ORDER — ENOXAPARIN SODIUM 40 MG/0.4ML ~~LOC~~ SOLN: 40.0000 mg | SUBCUTANEOUS | Status: DC

## 2019-09-05 MED ORDER — ACETAMINOPHEN 650 MG RE SUPP: 650.0000 mg | Freq: Four times a day (QID) | RECTAL | Status: DC | PRN

## 2019-09-05 MED ORDER — LISINOPRIL 20 MG PO TABS: 20.0000 mg | ORAL_TABLET | Freq: Two times a day (BID) | ORAL | Status: DC

## 2019-09-05 MED ORDER — ONDANSETRON HCL 4 MG PO TABS: 4.0000 mg | ORAL_TABLET | Freq: Four times a day (QID) | ORAL | Status: DC | PRN

## 2019-09-05 MED ORDER — SODIUM CHLORIDE 0.9% FLUSH
3.0000 mL | Freq: Two times a day (BID) | INTRAVENOUS | Status: DC
  Administered 2019-09-05 – 2019-09-06 (×4): 3 mL via INTRAVENOUS

## 2019-09-05 MED ORDER — HYDROCHLOROTHIAZIDE 12.5 MG PO CAPS: 12.5000 mg | ORAL_CAPSULE | Freq: Two times a day (BID) | ORAL | Status: DC

## 2019-09-05 MED ORDER — ACETAMINOPHEN 325 MG PO TABS
650.0000 mg | ORAL_TABLET | Freq: Four times a day (QID) | ORAL | Status: DC | PRN
  Administered 2019-09-05: 650 mg via ORAL
  Filled 2019-09-05: qty 2

## 2019-09-05 NOTE — ED Notes (Signed)
Breakfast ordered by Shayon Trompeter 

## 2019-09-05 NOTE — ED Notes (Signed)
Patient transported to Ultrasound 

## 2019-09-05 NOTE — Progress Notes (Signed)
°  Echocardiogram 2D Echocardiogram has been performed.  Gerda Diss 09/05/2019, 4:07 PM

## 2019-09-05 NOTE — Assessment & Plan Note (Signed)
-  Incidental finding on CT -TSH is normal -Follow-up thyroid ultrasound

## 2019-09-05 NOTE — ED Notes (Signed)
Help get patient straighten up in bed breakfast was given patient is resting with call bell in reach

## 2019-09-05 NOTE — Assessment & Plan Note (Signed)
-  Etiology this time is considered possibly due to hypotension at home.  Blood pressure is low normal on admission and given her age, she is likely at a lower goal than recommended -Orthostatic blood pressures negative x2 -Follow-up echo.  She also has history of bradycardia with review of her EKGs dating back to 87.  If holding blood pressure medications does not improve her symptoms, she may need Zio patch at discharge with outpatient cardiology follow-up for possible consideration of pacemaker

## 2019-09-05 NOTE — Assessment & Plan Note (Signed)
-  Hold blood pressure meds for now.  Her blood pressure on admission in the morning following is lower than her goal for her age and comorbidity risk.  Allow for liberalization of blood pressure especially in setting of syncope -May consider resuming at lower doses prior to discharge -Repeat orthostatic blood pressures.  They were done at 9 AM on 09/05/2019 and still negative

## 2019-09-05 NOTE — ED Notes (Signed)
Lunch Tray Ordered @ 1044.  

## 2019-09-05 NOTE — Assessment & Plan Note (Signed)
Continue statin. 

## 2019-09-05 NOTE — ED Notes (Signed)
Walk patient to the bathroom patient did well 

## 2019-09-05 NOTE — H&P (Signed)
History and Physical    Stephanie Vang DJT:[id number] DOB: 1946/01/14 DOA: 09/04/2019  PCP: Lonie Peak, PA-C  Patient coming from: Home  I have personally briefly reviewed patient's old medical records in [facility]  Chief Complaint: Syncope  HPI: Stephanie Vang is a 74 y.o. female with medical history significant of HTN, HLD.  Pt with 2 episodes of syncope yesterday with pro-drome of dizziness.  One at grocery store and one at home.  Today had a 3rd episode of syncope, this one without any pro-drome or warning.  She states that she was ironing when she began to feel the blood rush out of her head and she passed out, falling backwards and striking her head on a TV stand.  No fevers, chills, CP, N/V/D, SOB, blood in stool.  No recent medication changes, has been on the same BP medication chronically.   ED Course: Lab work not really remarkable.  Trop neg.  COVID neg  CXR neg  CT head+neck - scalp lac and hematoma, nothing intracranial though.  C-Spine okay.  Did see goiter.   Review of Systems: As per HPI, otherwise all review of systems negative.  Past Medical History:  Diagnosis Date  . Hypertension   . Panic attacks     History reviewed. No pertinent surgical history.   reports that she has never smoked. She does not have any smokeless tobacco history on file. She reports that she does not drink alcohol and does not use drugs.  Allergies  Allergen Reactions  . Biaxin [Clarithromycin]     rash    No family history on file. No sick contacts  Prior to Admission medications   Medication Sig Start Date End Date Taking? Authorizing Provider  atorvastatin (LIPITOR) 20 MG tablet Take 20 mg by mouth every evening. 08/18/19  Yes [provider]  escitalopram (LEXAPRO) 20 MG tablet Take 20 mg by mouth daily. 08/18/19  Yes [provider]  lisinopril-hydrochlorothiazide (ZESTORETIC) 20-12.5 MG tablet Take 1 tablet by mouth 2 (two) times daily.  08/07/19  Yes [provider]    Physical Exam: Vitals:   09/04/19 2024 09/04/19 2130 09/04/19 2200 09/04/19 2219  BP: (!) 150/63 (!) 133/58 (!) 117/59   Pulse: 60 64 63 64  Resp: 20 17 12 15   Temp: 98.3 F (36.8 C)     TempSrc: Oral     SpO2: 100% 99% 100% 100%    Constitutional: NAD, calm, comfortable Eyes: PERRL, lids and conjunctivae normal ENMT: Mucous membranes are moist. Posterior pharynx clear of any exudate or lesions.Normal dentition.  Neck: normal, supple, no masses, no thyromegaly Respiratory: clear to auscultation bilaterally, no wheezing, no crackles. Normal respiratory effort. No accessory muscle use.  Cardiovascular: Regular rate and rhythm, no murmurs / rubs / gallops. No extremity edema. 2+ pedal pulses. No carotid bruits.  Abdomen: no tenderness, no masses palpated. No hepatosplenomegaly. Bowel sounds positive.  Musculoskeletal: no clubbing / cyanosis. No joint deformity upper and lower extremities. Good ROM, no contractures. Normal muscle tone.  Skin: no rashes, lesions, ulcers. No induration Neurologic: CN 2-12 grossly intact. Sensation intact, DTR normal. Strength 5/5 in all 4.  Psychiatric: Normal judgment and insight. Alert and oriented x 3. Normal mood.    Labs on Admission: I have personally reviewed following labs and imaging studies  CBC: Recent Labs  Lab 09/04/19 2300  WBC 8.7  HGB 11.1*  HCT 34.8*  MCV 95.6  PLT 262   Basic Metabolic Panel: Recent Labs  Lab 09/04/19 2020  NA 139  K 3.6  CL 103  CO2 24  GLUCOSE 101*  BUN 18  CREATININE 0.79  CALCIUM 9.4   GFR: CrCl cannot be calculated (Unknown ideal weight.). Liver Function Tests: Recent Labs  Lab 09/04/19 2020  AST 23  ALT 18  ALKPHOS 58  BILITOT 0.5  PROT 7.3  ALBUMIN 4.2   No results for input(s): LIPASE, AMYLASE in the last 168 hours. No results for input(s): AMMONIA in the last 168 hours. Coagulation Profile: No results for input(s): INR, PROTIME in the  last 168 hours. Cardiac Enzymes: No results for input(s): CKTOTAL, CKMB, CKMBINDEX, TROPONINI in the last 168 hours. BNP (last 3 results) No results for input(s): PROBNP in the last 8760 hours. HbA1C: No results for input(s): HGBA1C in the last 72 hours. CBG: Recent Labs  Lab 09/04/19 2020  GLUCAP 85   Lipid Profile: No results for input(s): CHOL, HDL, LDLCALC, TRIG, CHOLHDL, LDLDIRECT in the last 72 hours. Thyroid Function Tests: No results for input(s): TSH, T4TOTAL, FREET4, T3FREE, THYROIDAB in the last 72 hours. Anemia Panel: No results for input(s): VITAMINB12, FOLATE, FERRITIN, TIBC, IRON, RETICCTPCT in the last 72 hours. Urine analysis: No results found for: COLORURINE, APPEARANCEUR, LABSPEC, PHURINE, GLUCOSEU, HGBUR, BILIRUBINUR, KETONESUR, PROTEINUR, UROBILINOGEN, NITRITE, LEUKOCYTESUR  Radiological Exams on Admission: DG Chest 2 View  Result Date: 09/04/2019 CLINICAL DATA:  Syncope. EXAM: CHEST - 2 VIEW COMPARISON:  None. FINDINGS: Very mild, diffuse chronic appearing increased lung markings are seen without evidence of acute infiltrate, pleural effusion or pneumothorax. The heart size and mediastinal contours are within normal limits. Multilevel degenerative changes seen throughout the thoracic spine. IMPRESSION: No active cardiopulmonary disease. Electronically Signed   By: Aram Candela M.D.   On: 09/04/2019 21:04   CT Head Wo Contrast  Result Date: 09/04/2019 CLINICAL DATA:  Head trauma. Struck posterior aspect of head on a wooden TV stand. Multiple falls over the last 3 days. Scalp hematoma. EXAM: CT HEAD WITHOUT CONTRAST CT CERVICAL SPINE WITHOUT CONTRAST TECHNIQUE: Multidetector CT imaging of the head and cervical spine was performed following the standard protocol without intravenous contrast. Multiplanar CT image reconstructions of the cervical spine were also generated. COMPARISON:  None. FINDINGS: CT HEAD FINDINGS Brain: No acute infarct, hemorrhage, or mass  lesion is present. Mild atrophy and white matter changes are present. The ventricles are of normal size. No significant extraaxial fluid collection is present. The brainstem and cerebellum are within normal limits. Vascular: Atherosclerotic changes are present in the cavernous internal carotid arteries bilaterally. No hyperdense vessel is present. Skull: Right paramedian scalp laceration and hematoma is present near the vertex. No underlying fracture is present. Sinuses/Orbits: The paranasal sinuses and mastoid air cells are clear. Bilateral lens replacements are noted. Globes and orbits are otherwise unremarkable. CT CERVICAL SPINE FINDINGS Alignment: No significant listhesis is present. Cervical lordosis preserved. Skull base and vertebrae: Craniocervical junction is within normal limits. Vertebral body heights are normal. No acute or healing fractures are present. Soft tissues and spinal canal: A multinodular goiter of the thyroid is present. No dominant nodule is present. No significant adenopathy is present. Disc levels: Multilevel degenerative changes are present. Moderate right foraminal stenosis is present at C4-5 due to facet and uncovertebral spurring. Upper chest: Lung apices are clear. Thoracic inlet is within normal limits. IMPRESSION: 1. Right paramedian scalp laceration and hematoma near the vertex without underlying fracture. 2. Normal CT appearance of the brain for age. No acute intracranial abnormality. 3. No  acute fracture or traumatic subluxation in the cervical spine. 4. Multilevel degenerative changes of the cervical spine as described. 5. Multinodular goiter. Recommend thyroid ultrasound (ref: J Am Coll Radiol. 2015 Feb;12(2): 143-50). Electronically Signed   By: Marin Robertshristopher  Mattern M.D.   On: 09/04/2019 20:59   CT Cervical Spine Wo Contrast  Result Date: 09/04/2019 CLINICAL DATA:  Head trauma. Struck posterior aspect of head on a wooden TV stand. Multiple falls over the last 3 days.  Scalp hematoma. EXAM: CT HEAD WITHOUT CONTRAST CT CERVICAL SPINE WITHOUT CONTRAST TECHNIQUE: Multidetector CT imaging of the head and cervical spine was performed following the standard protocol without intravenous contrast. Multiplanar CT image reconstructions of the cervical spine were also generated. COMPARISON:  None. FINDINGS: CT HEAD FINDINGS Brain: No acute infarct, hemorrhage, or mass lesion is present. Mild atrophy and white matter changes are present. The ventricles are of normal size. No significant extraaxial fluid collection is present. The brainstem and cerebellum are within normal limits. Vascular: Atherosclerotic changes are present in the cavernous internal carotid arteries bilaterally. No hyperdense vessel is present. Skull: Right paramedian scalp laceration and hematoma is present near the vertex. No underlying fracture is present. Sinuses/Orbits: The paranasal sinuses and mastoid air cells are clear. Bilateral lens replacements are noted. Globes and orbits are otherwise unremarkable. CT CERVICAL SPINE FINDINGS Alignment: No significant listhesis is present. Cervical lordosis preserved. Skull base and vertebrae: Craniocervical junction is within normal limits. Vertebral body heights are normal. No acute or healing fractures are present. Soft tissues and spinal canal: A multinodular goiter of the thyroid is present. No dominant nodule is present. No significant adenopathy is present. Disc levels: Multilevel degenerative changes are present. Moderate right foraminal stenosis is present at C4-5 due to facet and uncovertebral spurring. Upper chest: Lung apices are clear. Thoracic inlet is within normal limits. IMPRESSION: 1. Right paramedian scalp laceration and hematoma near the vertex without underlying fracture. 2. Normal CT appearance of the brain for age. No acute intracranial abnormality. 3. No acute fracture or traumatic subluxation in the cervical spine. 4. Multilevel degenerative changes of  the cervical spine as described. 5. Multinodular goiter. Recommend thyroid ultrasound (ref: J Am Coll Radiol. 2015 Feb;12(2): 143-50). Electronically Signed   By: Marin Robertshristopher  Mattern M.D.   On: 09/04/2019 20:59    EKG: Independently reviewed.  Assessment/Plan Principal Problem:   Syncope Active Problems:   HTN (hypertension)    1. Syncope - 1. Syncope pathway 2. 2d echo 3. Tele monitor 2. Goiter - seen on CT head+neck 1. Check TSH 2. Getting thyroid US as they recd 3. HTN - 1. Cont Lisinopril-HCTZ 4. HLD - cont statin  DVT prophylaxis: SCDs - due to head lac and scalp hematoma Code Status: Full Family Communication: No family in room Disposition Plan: Home after syncope work up CiscoConsults called: None Admission status: Place in [place]    Myshawn Chiriboga M. DO [facility]  How to contact the [facility] Attending or Consulting provider 7A - 7P or covering provider during after hours 7P -7A, for this patient?  1. Check the care team in [facility] and look for a) attending/consulting [facility] provider listed and b) the [facility] team listed 2. Log into [email or web address]  Amion Physician Scheduling and messaging for groups and whole hospitals  On call and physician scheduling software for group practices, residents, hospitalists and other medical providers for call, clinic, rotation and shift schedules. On[employer] is a hospital-wide system for scheduling doctors and paging doctors on call. EasyPlot is for  scientific plotting and data analysis.  [email or web address]  and use [facility]'s universal password to access. If you do not have the password, please contact the hospital operator.  3. Locate the [facility] provider you are looking for under [facility] and page to a number that you can be directly reached. 4. If you still have difficulty reaching the provider, please page the [facility] (Director on Call) for the Hospitalists listed on amion for assistance.  09/05/2019, 12:14 AM

## 2019-09-05 NOTE — Hospital Course (Addendum)
Ms. Gable is a 74 yo CF with PMH HTN, HLD who presented to the ER after a syncopal episode at home.  She also endorsed having similar episodes in the past but has not sought treatment or work-up for them.  She had stated that she was ironing when she started feeling hot and lightheaded, then passed out and fell backwards striking her head on a TV stand.  She had a laceration on her posterior scalp and received 2 staples for bleeding control in the ER.  She was then admitted for further syncope work-up.  Orthostatic blood pressure was checked twice initially on admission and the following morning with both checks being negative. Her blood pressure slowly trended back to higher limit of normal range.  Heart rate still remained borderline bradycardic in the low 60s.  She remained asymptomatic during hospitalization and monitoring.  Her echo showed normal EF, 60% and grade 1 diastolic dysfunction. At discharge her blood pressure regimen was modified.  She was continued only on lisinopril 20 mg daily and will follow up outpatient.  On CT scan on admission, she is found to have incidental thyroid nodules.  She underwent ultrasound during hospitalization was found to have 5 nodules, 2 of which met criteria for requiring biopsy.  Findings were discussed with the patient bedside and she was also referred to endocrine at discharge for follow-up and pursuing biopsies.  She will also follow-up with her PCP for scalp laceration follow-up and staple removal.

## 2019-09-05 NOTE — Progress Notes (Signed)
PROGRESS NOTE    Stephanie Vang   RUE:[id number]  DOB: December 30, 1945  DOA: 09/04/2019     0  PCP: Stephanie Peak, PA-C  CC: passed out at home  Hospital Course: [name] is a 74 yo CF with PMH HTN, HLD who presented to the ER after a syncopal episode at home.  She also endorsed having similar episodes in the past but has not sought treatment or work-up for them.  She had stated that she was ironing when she started feeling hot and lightheaded, then passed out and fell backwards striking her head on a TV stand.  She had a laceration on her posterior scalp and received 2 staples for bleeding control in the ER.  She was then admitted for further syncope work-up.  Orthostatic blood pressure was checked twice initially on admission and the following morning with both checks being negative.   Interval History:  Laying in bed this morning in no distress.  Discussed events leading up to hospitalization again.  She states again feeling warm all over followed by dizziness then passing out.  Struck her head on the TV stand.  Some soreness as expected this morning but denies any headaches or vision changes.  Old records reviewed in assessment of this patient  ROS: Constitutional: negative for chills and fevers, Respiratory: negative for cough, Cardiovascular: negative for chest pain and Gastrointestinal: negative for abdominal pain  Assessment & Plan: HTN (hypertension) -Hold blood pressure meds for now.  Her blood pressure on admission in the morning following is lower than her goal for her age and comorbidity risk.  Allow for liberalization of blood pressure especially in setting of syncope -May consider resuming at lower doses prior to discharge -Repeat orthostatic blood pressures.  They were done at 9 AM on 09/05/2019 and still negative  Syncope -Etiology this time is considered possibly due to hypotension at home.  Blood pressure is low normal on admission and given her age, she is likely at a  lower goal than recommended -Orthostatic blood pressures negative x2 -Follow-up echo.  She also has history of bradycardia with review of her EKGs dating back to 89.  If holding blood pressure medications does not improve her symptoms, she may need Zio patch at discharge with outpatient cardiology follow-up for possible consideration of pacemaker  Goiter -Incidental finding on CT -TSH is normal -Follow-up thyroid ultrasound  Hyperlipidemia -Continue statin  Scalp laceration -Due to syncopal episode at home -2 staples placed on posterior scalp while in the ER -Outpatient follow-up for staple removal   Antimicrobials: None  DVT prophylaxis: SCD Code Status: Full Family Communication: None present Disposition Plan:  Status is: Observation  The patient remains OBS appropriate and will d/c before 2 midnights.  Dispo: The patient is from: Home              Anticipated d/c is to: Home              Anticipated d/c date is: 1 day              Patient currently is not medically stable to d/c.   Objective: Blood pressure (!) 144/70, pulse 71, temperature 98.2 F (36.8 C), temperature source Oral, resp. rate 20, height  (1.549 m), weight 73.5 kg, SpO2 100 %.  Examination: General appearance: alert, cooperative and no distress Head: Scalp laceration appreciated on upper posterior scalp with 2 staples in place and surrounding dried blood, no obvious swelling or fluctuance Eyes: EOMI Lungs: clear  to auscultation bilaterally Heart: regular rate and rhythm and S1, S2 normal Abdomen: normal findings: bowel sounds normal and soft, non-tender Extremities: No edema Skin: mobility and turgor normal Neurologic: Grossly normal   Consultants:   N/A  Procedures:   N/a  Data Reviewed: I have personally reviewed following labs and imaging studies Results for orders placed or performed during the hospital encounter of 09/04/19 (from the past 24 hour(s))  CBG monitoring, ED      Status: None   Collection Time: 09/04/19  8:20 PM  Result Value Ref Range   Glucose-Capillary 85 70 - 99 mg/dL  Comprehensive metabolic panel     Status: Abnormal   Collection Time: 09/04/19  8:20 PM  Result Value Ref Range   Sodium 139 135 - 145 mmol/L   Potassium 3.6 3.5 - 5.1 mmol/L   Chloride 103 98 - 111 mmol/L   CO2 24 22 - 32 mmol/L   Glucose, Bld 101 (H) 70 - 99 mg/dL   BUN 18 8 - 23 mg/dL   Creatinine, Ser 0.10 0.44 - 1.00 mg/dL   Calcium 9.4 8.9 - 93.2 mg/dL   Total Protein 7.3 6.5 - 8.1 g/dL   Albumin 4.2 3.5 - 5.0 g/dL   AST 23 15 - 41 U/L   ALT 18 0 - 44 U/L   Alkaline Phosphatase 58 38 - 126 U/L   Total Bilirubin 0.5 0.3 - 1.2 mg/dL   GFR calc non Af Amer >60 >60 mL/min   GFR calc Af Amer >60 >60 mL/min   Anion gap 12 5 - 15  SARS Coronavirus 2 by RT PCR (hospital order, performed in [facility] hospital lab) Nasopharyngeal Nasopharyngeal Swab     Status: None   Collection Time: 09/04/19  9:48 PM   Specimen: Nasopharyngeal Swab  Result Value Ref Range   SARS Coronavirus 2 NEGATIVE NEGATIVE  Troponin I (High Sensitivity)     Status: None   Collection Time: 09/04/19  9:57 PM  Result Value Ref Range   Troponin I (High Sensitivity) 6 <18 ng/L  CBC     Status: Abnormal   Collection Time: 09/04/19 11:00 PM  Result Value Ref Range   WBC 8.7 4.0 - 10.5 K/uL   RBC 3.64 (L) 3.87 - 5.11 MIL/uL   Hemoglobin 11.1 (L) 12.0 - 15.0 g/dL   HCT 35.5 (L) 36 - 46 %   MCV 95.6 80.0 - 100.0 fL   MCH 30.5 26.0 - 34.0 pg   MCHC 31.9 30.0 - 36.0 g/dL   RDW 73.2 20.2 - 54.2 %   Platelets 262 150 - 400 K/uL   nRBC 0.0 0.0 - 0.2 %  Urinalysis, Routine w reflex microscopic     Status: None   Collection Time: 09/04/19 11:49 PM  Result Value Ref Range   Color, Urine YELLOW YELLOW   APPearance CLEAR CLEAR   Specific Gravity, Urine 1.014 1.005 - 1.030   pH 6.0 5.0 - 8.0   Glucose, UA NEGATIVE NEGATIVE mg/dL   Hgb urine dipstick NEGATIVE NEGATIVE   Bilirubin Urine NEGATIVE NEGATIVE    Ketones, ur NEGATIVE NEGATIVE mg/dL   Protein, ur NEGATIVE NEGATIVE mg/dL   Nitrite NEGATIVE NEGATIVE   Leukocytes,Ua NEGATIVE NEGATIVE  TSH     Status: None   Collection Time: 09/05/19  1:40 AM  Result Value Ref Range   TSH 1.977 0.350 - 4.500 uIU/mL  CBC     Status: Abnormal   Collection Time: 09/05/19  4:10 AM  Result  Value Ref Range   WBC 8.8 4.0 - 10.5 K/uL   RBC 3.44 (L) 3.87 - 5.11 MIL/uL   Hemoglobin 10.3 (L) 12.0 - 15.0 g/dL   HCT 16.1 (L) 36 - 46 %   MCV 93.9 80.0 - 100.0 fL   MCH 29.9 26.0 - 34.0 pg   MCHC 31.9 30.0 - 36.0 g/dL   RDW 09.6 04.5 - 40.9 %   Platelets 247 150 - 400 K/uL   nRBC 0.0 0.0 - 0.2 %  Basic metabolic panel     Status: Abnormal   Collection Time: 09/05/19  4:10 AM  Result Value Ref Range   Sodium 140 135 - 145 mmol/L   Potassium 3.4 (L) 3.5 - 5.1 mmol/L   Chloride 104 98 - 111 mmol/L   CO2 27 22 - 32 mmol/L   Glucose, Bld 109 (H) 70 - 99 mg/dL   BUN 17 8 - 23 mg/dL   Creatinine, Ser 8.11 0.44 - 1.00 mg/dL   Calcium 9.2 8.9 - 91.4 mg/dL   GFR calc non Af Amer >60 >60 mL/min   GFR calc Af Amer >60 >60 mL/min   Anion gap 9 5 - 15  CBG monitoring, ED     Status: None   Collection Time: 09/05/19  5:07 AM  Result Value Ref Range   Glucose-Capillary 90 70 - 99 mg/dL  Iron and TIBC     Status: None   Collection Time: 09/05/19  8:19 AM  Result Value Ref Range   Iron 44 28 - 170 ug/dL   TIBC 782 956 - 213 ug/dL   Saturation Ratios 12 10.4 - 31.8 %   UIBC 314 ug/dL  Ferritin     Status: None   Collection Time: 09/05/19  8:19 AM  Result Value Ref Range   Ferritin 42 11 - 307 ng/mL  Vitamin B12     Status: None   Collection Time: 09/05/19  8:19 AM  Result Value Ref Range   Vitamin B-12 433 180 - 914 pg/mL  Folate, serum, performed at [facility] lab     Status: None   Collection Time: 09/05/19  8:19 AM  Result Value Ref Range   Folate 34.0 >5.9 ng/mL    Recent Results (from the past 240 hour(s))  SARS Coronavirus 2 by RT PCR (hospital  order, performed in [facility] hospital lab) Nasopharyngeal Nasopharyngeal Swab     Status: None   Collection Time: 09/04/19  9:48 PM   Specimen: Nasopharyngeal Swab  Result Value Ref Range Status   SARS Coronavirus 2 NEGATIVE NEGATIVE Final    Comment: (NOTE) SARS-CoV-2 target nucleic acids are NOT DETECTED.  The SARS-CoV-2 RNA is generally detectable in upper and lower respiratory specimens during the acute phase of infection. The lowest concentration of SARS-CoV-2 viral copies this assay can detect is 250 copies / mL. A negative result does not preclude SARS-CoV-2 infection and should not be used as the sole basis for treatment or other patient management decisions.  A negative result may occur with improper specimen collection / handling, submission of specimen other than nasopharyngeal swab, presence of viral mutation(s) within the areas targeted by this assay, and inadequate number of viral copies (<250 copies / mL). A negative result must be combined with clinical observations, patient history, and epidemiological information.  Fact Sheet for Patients:   BoilerBrush.com.cy  Fact Sheet for Healthcare Providers: https://pope.com/  This test is not yet approved or  cleared by the [place] FDA and has  been authorized for detection and/or diagnosis of SARS-CoV-2 by FDA under an Emergency Use Authorization (EUA).  This EUA will remain in effect (meaning this test can be used) for the duration of the COVID-19 declaration under Section 564(b)(1) of the Act, 21 U.S.C. section 360bbb-3(b)(1), unless the authorization is terminated or revoked sooner.  Performed at [facility], [address], [place], [place] [address]      Radiology Studies: DG Chest 2 View  Result Date: 09/04/2019 CLINICAL DATA:  Syncope. EXAM: CHEST - 2 VIEW COMPARISON:  None. FINDINGS: Very mild, diffuse chronic appearing increased lung markings are  seen without evidence of acute infiltrate, pleural effusion or pneumothorax. The heart size and mediastinal contours are within normal limits. Multilevel degenerative changes seen throughout the thoracic spine. IMPRESSION: No active cardiopulmonary disease. Electronically Signed   By: Aram Candela M.D.   On: 09/04/2019 21:04   CT Head Wo Contrast  Result Date: 09/04/2019 CLINICAL DATA:  Head trauma. Struck posterior aspect of head on a wooden TV stand. Multiple falls over the last 3 days. Scalp hematoma. EXAM: CT HEAD WITHOUT CONTRAST CT CERVICAL SPINE WITHOUT CONTRAST TECHNIQUE: Multidetector CT imaging of the head and cervical spine was performed following the standard protocol without intravenous contrast. Multiplanar CT image reconstructions of the cervical spine were also generated. COMPARISON:  None. FINDINGS: CT HEAD FINDINGS Brain: No acute infarct, hemorrhage, or mass lesion is present. Mild atrophy and white matter changes are present. The ventricles are of normal size. No significant extraaxial fluid collection is present. The brainstem and cerebellum are within normal limits. Vascular: Atherosclerotic changes are present in the cavernous internal carotid arteries bilaterally. No hyperdense vessel is present. Skull: Right paramedian scalp laceration and hematoma is present near the vertex. No underlying fracture is present. Sinuses/Orbits: The paranasal sinuses and mastoid air cells are clear. Bilateral lens replacements are noted. Globes and orbits are otherwise unremarkable. CT CERVICAL SPINE FINDINGS Alignment: No significant listhesis is present. Cervical lordosis preserved. Skull base and vertebrae: Craniocervical junction is within normal limits. Vertebral body heights are normal. No acute or healing fractures are present. Soft tissues and spinal canal: A multinodular goiter of the thyroid is present. No dominant nodule is present. No significant adenopathy is present. Disc levels:  Multilevel degenerative changes are present. Moderate right foraminal stenosis is present at C4-5 due to facet and uncovertebral spurring. Upper chest: Lung apices are clear. Thoracic inlet is within normal limits. IMPRESSION: 1. Right paramedian scalp laceration and hematoma near the vertex without underlying fracture. 2. Normal CT appearance of the brain for age. No acute intracranial abnormality. 3. No acute fracture or traumatic subluxation in the cervical spine. 4. Multilevel degenerative changes of the cervical spine as described. 5. Multinodular goiter. Recommend thyroid ultrasound (ref: J Am Coll Radiol. 2015 Feb;12(2): 143-50). Electronically Signed   By: Marin Roberts M.D.   On: 09/04/2019 20:59   CT Cervical Spine Wo Contrast  Result Date: 09/04/2019 CLINICAL DATA:  Head trauma. Struck posterior aspect of head on a wooden TV stand. Multiple falls over the last 3 days. Scalp hematoma. EXAM: CT HEAD WITHOUT CONTRAST CT CERVICAL SPINE WITHOUT CONTRAST TECHNIQUE: Multidetector CT imaging of the head and cervical spine was performed following the standard protocol without intravenous contrast. Multiplanar CT image reconstructions of the cervical spine were also generated. COMPARISON:  None. FINDINGS: CT HEAD FINDINGS Brain: No acute infarct, hemorrhage, or mass lesion is present. Mild atrophy and white matter changes are present. The ventricles are of  normal size. No significant extraaxial fluid collection is present. The brainstem and cerebellum are within normal limits. Vascular: Atherosclerotic changes are present in the cavernous internal carotid arteries bilaterally. No hyperdense vessel is present. Skull: Right paramedian scalp laceration and hematoma is present near the vertex. No underlying fracture is present. Sinuses/Orbits: The paranasal sinuses and mastoid air cells are clear. Bilateral lens replacements are noted. Globes and orbits are otherwise unremarkable. CT CERVICAL SPINE FINDINGS  Alignment: No significant listhesis is present. Cervical lordosis preserved. Skull base and vertebrae: Craniocervical junction is within normal limits. Vertebral body heights are normal. No acute or healing fractures are present. Soft tissues and spinal canal: A multinodular goiter of the thyroid is present. No dominant nodule is present. No significant adenopathy is present. Disc levels: Multilevel degenerative changes are present. Moderate right foraminal stenosis is present at C4-5 due to facet and uncovertebral spurring. Upper chest: Lung apices are clear. Thoracic inlet is within normal limits. IMPRESSION: 1. Right paramedian scalp laceration and hematoma near the vertex without underlying fracture. 2. Normal CT appearance of the brain for age. No acute intracranial abnormality. 3. No acute fracture or traumatic subluxation in the cervical spine. 4. Multilevel degenerative changes of the cervical spine as described. 5. Multinodular goiter. Recommend thyroid ultrasound (ref: J Am Coll Radiol. 2015 Feb;12(2): 143-50). Electronically Signed   By: Marin Robertshristopher  Mattern M.D.   On: 09/04/2019 20:59   US THYROID  Result Date: 09/05/2019 CLINICAL DATA:  Multinodular goiter by cervical spine CT EXAM: THYROID ULTRASOUND TECHNIQUE: Ultrasound examination of the thyroid gland and adjacent soft tissues was performed. COMPARISON:  09/04/2019 FINDINGS: Parenchymal Echotexture: Moderately heterogenous Isthmus: 3 mm Right lobe: 4.8 x 1.9 x 2.7 cm Left lobe: 5.7 x 2.9 x 2.3 cm _________________________________________________________ Estimated total number of nodules >/= 1 cm: 5 Number of spongiform nodules >/=  2 cm not described below (TR1): 0 Number of mixed cystic and solid nodules >/= 1.5 cm not described below (TR2): 0 _________________________________________________________ Nodule # 1: Location: Right; Superior Maximum size: 2.6 cm; Other 2 dimensions: 1.5 x 1.3 cm Composition: solid/almost completely solid (2)  Echogenicity: hyperechoic (1) Shape: not taller-than-wide (0) Margins: ill-defined (0) Echogenic foci: macrocalcifications (1) ACR TI-RADS total points: 4. ACR TI-RADS risk category: TR4 (4-6 points). ACR TI-RADS recommendations: **Given size (>/= 1.5 cm) and appearance, fine needle aspiration of this moderately suspicious nodule should be considered based on TI-RADS criteria. _________________________________________________________ Nodule # 2: Location: Right; Mid Maximum size: 1.4 cm; Other 2 dimensions: 1.2 x 0.6 cm Composition: solid/almost completely solid (2) Echogenicity: hypoechoic (2) Shape: not taller-than-wide (0) Margins: ill-defined (0) Echogenic foci: none (0) ACR TI-RADS total points: 4. ACR TI-RADS risk category: TR4 (4-6 points). ACR TI-RADS recommendations: *Given size (>/= 1 - 1.4 cm) and appearance, a follow-up ultrasound in 1 year should be considered based on TI-RADS criteria. _________________________________________________________ Nodule # 4: Location: Left; Superior Maximum size: 1.2 cm; Other 2 dimensions: 1.1 x 0.9 cm Composition: solid/almost completely solid (2) Echogenicity: hypoechoic (2) Shape: not taller-than-wide (0) Margins: smooth (0) Echogenic foci: none (0) ACR TI-RADS total points: 4. ACR TI-RADS risk category: TR4 (4-6 points). ACR TI-RADS recommendations: *Given size (>/= 1 - 1.4 cm) and appearance, a follow-up ultrasound in 1 year should be considered based on TI-RADS criteria. _________________________________________________________ Nodule # 5: Location: Left; Mid Maximum size: 2.5 cm; Other 2 dimensions: 2.3 x 1.4 cm Composition: solid/almost completely solid (2) Echogenicity: hypoechoic (2) Shape: not taller-than-wide (0) Margins: ill-defined (0) Echogenic foci: none (0) ACR TI-RADS  total points: 4. ACR TI-RADS risk category: TR4 (4-6 points). ACR TI-RADS recommendations: **Given size (>/= 1.5 cm) and appearance, fine needle aspiration of this moderately suspicious  nodule should be considered based on TI-RADS criteria. _________________________________________________________ Moderate thyroid heterogeneity. Additional bilateral mixed cystic/solid nodules noted all measuring 1.3 cm or less in size. These would not meet criteria for any biopsy or follow-up and are not fully described by TI rads criteria. No hypervascularity. Scattered dystrophic shadowing calcifications in the thyroid. No regional adenopathy. IMPRESSION: 2.6 cm right superior TR 4 nodule and 2.5 cm left mid thyroid TR 4 nodule. Both meet criteria for biopsy as above. (Nodules 1 and 5). Additional nodules meet criteria follow-up in 1 year as above. The above is in keeping with the ACR TI-RADS recommendations - J Am Coll Radiol 2017;14:587-595. Electronically Signed   By: Judie Petit.  Shick M.D.   On: 09/05/2019 09:42   US THYROID  Final Result    DG Chest 2 View  Final Result    CT Head Wo Contrast  Final Result    CT Cervical Spine Wo Contrast  Final Result       Scheduled Meds: . atorvastatin  20 mg Oral QPM  . escitalopram  20 mg Oral Daily  . sodium chloride flush  3 mL Intravenous Q12H   PRN Meds: acetaminophen **OR** [DISCONTINUED] acetaminophen, ondansetron **OR** [DISCONTINUED] ondansetron (ZOFRAN) IV, perflutren lipid microspheres (DEFINITY) IV suspension Continuous Infusions:    LOS: 0 days  Time spent: Greater than 50% of the 35 minute visit was spent in counseling/coordination of care for the patient as laid out in the A&P.   Lewie Chamber, MD [facility] 09/05/2019, 4:20 PM   Contact via secure chat.  To contact the attending provider between 7A-7P or the covering provider during after hours 7P-7A, please log into the web site [email or web address] and access using universal [place] password for that web site. If you do not have the password, please call the hospital operator.

## 2019-09-05 NOTE — ED Notes (Signed)
Patient is now back in bed with call bell in reach 

## 2019-09-05 NOTE — ED Notes (Signed)
Pt taken to US

## 2019-09-05 NOTE — Assessment & Plan Note (Signed)
-  Due to syncopal episode at home -2 staples placed on posterior scalp while in the ER -Outpatient follow-up for staple removal

## 2019-09-06 DIAGNOSIS — R55 Syncope and collapse: Secondary | ICD-10-CM | POA: Diagnosis not present

## 2019-09-06 DIAGNOSIS — E049 Nontoxic goiter, unspecified: Secondary | ICD-10-CM | POA: Diagnosis not present

## 2019-09-06 DIAGNOSIS — S0101XA Laceration without foreign body of scalp, initial encounter: Secondary | ICD-10-CM

## 2019-09-06 DIAGNOSIS — E041 Nontoxic single thyroid nodule: Secondary | ICD-10-CM

## 2019-09-06 LAB — CBC WITH DIFFERENTIAL/PLATELET
Abs Immature Granulocytes: 0.02 10*3/uL (ref 0.00–0.07)
Basophils Absolute: 0.1 10*3/uL (ref 0.0–0.1)
Basophils Relative: 1 %
Eosinophils Absolute: 0.2 10*3/uL (ref 0.0–0.5)
Eosinophils Relative: 3 %
HCT: 33.8 % — ABNORMAL LOW (ref 36.0–46.0)
Hemoglobin: 10.8 g/dL — ABNORMAL LOW (ref 12.0–15.0)
Immature Granulocytes: 0 %
Lymphocytes Relative: 33 %
Lymphs Abs: 2.4 10*3/uL (ref 0.7–4.0)
MCH: 30.3 pg (ref 26.0–34.0)
MCHC: 32 g/dL (ref 30.0–36.0)
MCV: 94.7 fL (ref 80.0–100.0)
Monocytes Absolute: 0.8 10*3/uL (ref 0.1–1.0)
Monocytes Relative: 10 %
Neutro Abs: 3.9 10*3/uL (ref 1.7–7.7)
Neutrophils Relative %: 53 %
Platelets: 257 10*3/uL (ref 150–400)
RBC: 3.57 MIL/uL — ABNORMAL LOW (ref 3.87–5.11)
RDW: 12.9 % (ref 11.5–15.5)
WBC: 7.3 10*3/uL (ref 4.0–10.5)
nRBC: 0 % (ref 0.0–0.2)

## 2019-09-06 LAB — BASIC METABOLIC PANEL
Anion gap: 9 (ref 5–15)
BUN: 11 mg/dL (ref 8–23)
CO2: 28 mmol/L (ref 22–32)
Calcium: 9.1 mg/dL (ref 8.9–10.3)
Chloride: 105 mmol/L (ref 98–111)
Creatinine, Ser: 0.75 mg/dL (ref 0.44–1.00)
GFR calc Af Amer: >60 mL/min (ref >60)
GFR calc non Af Amer: >60 mL/min (ref >60)
Glucose, Bld: 104 mg/dL — ABNORMAL HIGH (ref 70–99)
Potassium: 3.4 mmol/L — ABNORMAL LOW (ref 3.5–5.1)
Sodium: 142 mmol/L (ref 135–145)

## 2019-09-06 LAB — GLUCOSE, CAPILLARY: Glucose-Capillary: 106 mg/dL — ABNORMAL HIGH (ref 70–99)

## 2019-09-06 LAB — MAGNESIUM: Magnesium: 1.9 mg/dL (ref 1.7–2.4)

## 2019-09-06 MED ORDER — LISINOPRIL 20 MG PO TABS
20.0000 mg | ORAL_TABLET | Freq: Every day | ORAL | 3 refills | Status: AC
Start: 2019-09-06 — End: 2020-09-05

## 2019-09-06 NOTE — Progress Notes (Signed)
Patient discharged: Home with family  Via: Wheelchair   Discharge paperwork given: to patient and family  Reviewed with teach back  IV and telemetry disconnected  Belongings given to patient    

## 2019-09-06 NOTE — Care Management Obs Status (Signed)
MEDICARE OBSERVATION STATUS NOTIFICATION   Patient Details  Name: Stephanie Vang MRN: [id number] Date of Birth: Aug 24, 1945   Medicare Observation Status Notification Given:  Yes    Lawerance Sabal, RN 09/06/2019, 9:32 AM

## 2019-09-06 NOTE — Discharge Summary (Signed)
Physician Discharge Summary  Stephanie Vang PJS:[id number] DOB: Oct 22, 1945 DOA: 09/04/2019  PCP: Cyndi Bender, PA-C  Admit date: 09/04/2019 Discharge date: 09/06/2019  Admitted From: Home Disposition: Home Discharging physician: Dwyane Dee, MD  Recommendations for Outpatient Follow-up:  1. Confirm referral to endocrinology and patient will need biopsy of thyroid nodule found incidentally on CT scan.  Patient aware of results 2. Needs staple removal from scalp laceration 3. Reviewed blood pressure trend since discharge from hospital and adjust medications as needed 4. If ongoing borderline bradycardia and becomes symptomatic, may need stronger consideration for possible pacemaker placement   Patient discharged to home in Discharge Condition: stable CODE STATUS: Full Diet recommendation:  Diet Orders (From admission, onward)    Start     Ordered   09/06/19 0000  Diet - low sodium heart healthy        09/06/19 0909   09/05/19 0010  Diet Heart Room service appropriate? Yes; Fluid consistency: Thin  Diet effective now       Question Answer Comment  Room service appropriate? Yes   Fluid consistency: Thin      09/05/19 0010          Hospital Course: Stephanie Vang is a 74 yo CF with PMH HTN, HLD who presented to the ER after a syncopal episode at home.  She also endorsed having similar episodes in the past but has not sought treatment or work-up for them.  She had stated that she was ironing when she started feeling hot and lightheaded, then passed out and fell backwards striking her head on a TV stand.  She had a laceration on her posterior scalp and received 2 staples for bleeding control in the ER.  She was then admitted for further syncope work-up.  Orthostatic blood pressure was checked twice initially on admission and the following morning with both checks being negative. Her blood pressure slowly trended back to higher limit of normal range.  Heart rate still remained borderline  bradycardic in the low 60s.  She remained asymptomatic during hospitalization and monitoring.  Her echo showed normal EF, 60% and grade 1 diastolic dysfunction. At discharge her blood pressure regimen was modified.  She was continued only on lisinopril 20 mg daily and will follow up outpatient.  On CT scan on admission, she is found to have incidental thyroid nodules.  She underwent ultrasound during hospitalization was found to have 5 nodules, 2 of which met criteria for requiring biopsy.  Findings were discussed with the patient bedside and she was also referred to endocrine at discharge for follow-up and pursuing biopsies.  She will also follow-up with her PCP for scalp laceration follow-up and staple removal.   HTN (hypertension) -Hold blood pressure meds for now.  Her blood pressure on admission in the morning following is lower than her goal for her age and comorbidity risk.  Allow for liberalization of blood pressure especially in setting of syncope -May consider resuming at lower doses prior to discharge -Repeat orthostatic blood pressures.  They were done at 9 AM on 09/05/2019 and still negative  Syncope -Etiology this time is considered possibly due to hypotension at home.  Blood pressure is low normal on admission and given her age, she is likely at a lower goal than recommended -Orthostatic blood pressures negative x2 -Follow-up echo.  She also has history of bradycardia with review of her EKGs dating back to 66.  If holding blood pressure medications does not improve her symptoms, she may need Zio  patch at discharge with outpatient cardiology follow-up for possible consideration of pacemaker  Goiter -Incidental finding on CT -TSH is normal -Follow-up thyroid ultrasound  Hyperlipidemia -Continue statin  Scalp laceration -Due to syncopal episode at home -2 staples placed on posterior scalp while in the ER -Outpatient follow-up for staple removal    The patient's chronic  medical conditions were treated accordingly per the patient's home medication regimen except as noted.  On day of discharge, patient was felt deemed stable for discharge. Patient/family member advised to call PCP or come back to ER if needed.   Discharge Diagnoses:   Principal Diagnosis: Syncope  Active Hospital Problems   Diagnosis Date Noted  . Thyroid nodule 09/06/2019  . HTN (hypertension) 09/05/2019  . Goiter 09/05/2019  . Hyperlipidemia 09/05/2019  . Scalp laceration 09/05/2019    Resolved Hospital Problems   Diagnosis Date Noted Date Resolved  . Syncope 09/05/2019 09/06/2019    Priority: High    Discharge Instructions    Ambulatory referral to Endocrinology   Complete by: As directed    Diet - low sodium heart healthy   Complete by: As directed    Increase activity slowly   Complete by: As directed    No wound care   Complete by: As directed      Allergies as of 09/06/2019      Reactions   Biaxin [clarithromycin]    rash      Medication List    STOP taking these medications   lisinopril-hydrochlorothiazide 20-12.5 MG tablet Commonly known as: ZESTORETIC     TAKE these medications   atorvastatin 20 MG tablet Commonly known as: LIPITOR Take 20 mg by mouth every evening.   escitalopram 20 MG tablet Commonly known as: LEXAPRO Take 20 mg by mouth daily.   lisinopril 20 MG tablet Commonly known as: ZESTRIL Take 1 tablet (20 mg total) by mouth daily.       Follow-up Information    Cyndi Bender, PA-C. Schedule an appointment as soon as possible for a visit in 3 day(s).   Specialty: Physician Assistant Why: Follow up for staple removal from scalp laceration; patient also needs referral to endocrine for thyroid nodule and scheduling of biopsy; patient aware of nodules Contact information: [place] [id number] [phone]              Allergies  Allergen Reactions  . Biaxin [Clarithromycin]     rash      Procedures/Studies: DG Chest 2 View  Result Date: 09/04/2019 CLINICAL DATA:  Syncope. EXAM: CHEST - 2 VIEW COMPARISON:  None. FINDINGS: Very mild, diffuse chronic appearing increased lung markings are seen without evidence of acute infiltrate, pleural effusion or pneumothorax. The heart size and mediastinal contours are within normal limits. Multilevel degenerative changes seen throughout the thoracic spine. IMPRESSION: No active cardiopulmonary disease. Electronically Signed   By: Virgina Norfolk M.D.   On: 09/04/2019 21:04   CT Head Wo Contrast  Result Date: 09/04/2019 CLINICAL DATA:  Head trauma. Struck posterior aspect of head on a wooden TV stand. Multiple falls over the last 3 days. Scalp hematoma. EXAM: CT HEAD WITHOUT CONTRAST CT CERVICAL SPINE WITHOUT CONTRAST TECHNIQUE: Multidetector CT imaging of the head and cervical spine was performed following the standard protocol without intravenous contrast. Multiplanar CT image reconstructions of the cervical spine were also generated. COMPARISON:  None. FINDINGS: CT HEAD FINDINGS Brain: No acute infarct, hemorrhage, or mass lesion is present. Mild atrophy and white matter changes are  present. The ventricles are of normal size. No significant extraaxial fluid collection is present. The brainstem and cerebellum are within normal limits. Vascular: Atherosclerotic changes are present in the cavernous internal carotid arteries bilaterally. No hyperdense vessel is present. Skull: Right paramedian scalp laceration and hematoma is present near the vertex. No underlying fracture is present. Sinuses/Orbits: The paranasal sinuses and mastoid air cells are clear. Bilateral lens replacements are noted. Globes and orbits are otherwise unremarkable. CT CERVICAL SPINE FINDINGS Alignment: No significant listhesis is present. Cervical lordosis preserved. Skull base and vertebrae: Craniocervical junction is within normal limits. Vertebral body heights are normal.  No acute or healing fractures are present. Soft tissues and spinal canal: A multinodular goiter of the thyroid is present. No dominant nodule is present. No significant adenopathy is present. Disc levels: Multilevel degenerative changes are present. Moderate right foraminal stenosis is present at C4-5 due to facet and uncovertebral spurring. Upper chest: Lung apices are clear. Thoracic inlet is within normal limits. IMPRESSION: 1. Right paramedian scalp laceration and hematoma near the vertex without underlying fracture. 2. Normal CT appearance of the brain for age. No acute intracranial abnormality. 3. No acute fracture or traumatic subluxation in the cervical spine. 4. Multilevel degenerative changes of the cervical spine as described. 5. Multinodular goiter. Recommend thyroid ultrasound (ref: J Am Coll Radiol. 2015 Feb;12(2): 143-50). Electronically Signed   By: San Morelle M.D.   On: 09/04/2019 20:59   CT Cervical Spine Wo Contrast  Result Date: 09/04/2019 CLINICAL DATA:  Head trauma. Struck posterior aspect of head on a wooden TV stand. Multiple falls over the last 3 days. Scalp hematoma. EXAM: CT HEAD WITHOUT CONTRAST CT CERVICAL SPINE WITHOUT CONTRAST TECHNIQUE: Multidetector CT imaging of the head and cervical spine was performed following the standard protocol without intravenous contrast. Multiplanar CT image reconstructions of the cervical spine were also generated. COMPARISON:  None. FINDINGS: CT HEAD FINDINGS Brain: No acute infarct, hemorrhage, or mass lesion is present. Mild atrophy and white matter changes are present. The ventricles are of normal size. No significant extraaxial fluid collection is present. The brainstem and cerebellum are within normal limits. Vascular: Atherosclerotic changes are present in the cavernous internal carotid arteries bilaterally. No hyperdense vessel is present. Skull: Right paramedian scalp laceration and hematoma is present near the vertex. No underlying  fracture is present. Sinuses/Orbits: The paranasal sinuses and mastoid air cells are clear. Bilateral lens replacements are noted. Globes and orbits are otherwise unremarkable. CT CERVICAL SPINE FINDINGS Alignment: No significant listhesis is present. Cervical lordosis preserved. Skull base and vertebrae: Craniocervical junction is within normal limits. Vertebral body heights are normal. No acute or healing fractures are present. Soft tissues and spinal canal: A multinodular goiter of the thyroid is present. No dominant nodule is present. No significant adenopathy is present. Disc levels: Multilevel degenerative changes are present. Moderate right foraminal stenosis is present at C4-5 due to facet and uncovertebral spurring. Upper chest: Lung apices are clear. Thoracic inlet is within normal limits. IMPRESSION: 1. Right paramedian scalp laceration and hematoma near the vertex without underlying fracture. 2. Normal CT appearance of the brain for age. No acute intracranial abnormality. 3. No acute fracture or traumatic subluxation in the cervical spine. 4. Multilevel degenerative changes of the cervical spine as described. 5. Multinodular goiter. Recommend thyroid ultrasound (ref: J Am Coll Radiol. 2015 Feb;12(2): 143-50). Electronically Signed   By: San Morelle M.D.   On: 09/04/2019 20:59   ECHOCARDIOGRAM COMPLETE  Result Date: 09/05/2019  ECHOCARDIOGRAM REPORT   Patient Name:   Stephanie Vang Date of Exam: 09/05/2019 Medical Rec #:  [id number]      Height:       61.0 in Accession #:    [id number]     Weight:       162.0 lb Date of Birth:  02-02-1946      BSA:          1.727 m Patient Age:    38 years       BP:           112/54 mmHg Patient Gender: F              HR:           65 bpm. Exam Location:  Inpatient Procedure: 2D Echo, Cardiac Doppler, Color Doppler and Intracardiac            Opacification Agent Indications:    Syncope  History:        Patient has no prior history of Echocardiogram  examinations.                 Risk Factors:Hypertension and Dyslipidemia.  Sonographer:    Clayton Lefort RDCS (AE) Referring Phys: [place]  1. No apical thrombus by Definity contrast. Left ventricular ejection fraction, by estimation, is 60 to 65%. The left ventricle has normal function. The left ventricle has no regional wall motion abnormalities. There is mild left ventricular hypertrophy. Left ventricular diastolic parameters are consistent with Grade I diastolic dysfunction (impaired relaxation).  2. Right ventricular systolic function is normal. The right ventricular size is normal. There is mildly elevated pulmonary artery systolic pressure. The estimated right ventricular systolic pressure is 74.9 mmHg.  3. The mitral valve is abnormal. Trivial mitral valve regurgitation.  4. The aortic valve is bicuspid. Aortic valve regurgitation is trivial. Mild aortic valve sclerosis is present, with no evidence of aortic valve stenosis.  5. The inferior vena cava is normal in size with <50% respiratory variability, suggesting right atrial pressure of 8 mmHg. FINDINGS  Left Ventricle: No apical thrombus by Definity contrast. Left ventricular ejection fraction, by estimation, is 60 to 65%. The left ventricle has normal function. The left ventricle has no regional wall motion abnormalities. Definity contrast agent was given IV to delineate the left ventricular endocardial borders. The left ventricular internal cavity size was normal in size. There is mild left ventricular hypertrophy. Left ventricular diastolic parameters are consistent with Grade I diastolic dysfunction (impaired relaxation). Indeterminate filling pressures. Right Ventricle: The right ventricular size is normal. No increase in right ventricular wall thickness. Right ventricular systolic function is normal. There is mildly elevated pulmonary artery systolic pressure. The tricuspid regurgitant velocity is 2.66  m/s, and with an assumed  right atrial pressure of 8 mmHg, the estimated right ventricular systolic pressure is 44.9 mmHg. Left Atrium: Left atrial size was normal in size. Right Atrium: Right atrial size was normal in size. Pericardium: There is no evidence of pericardial effusion. Mitral Valve: The mitral valve is abnormal. There is mild thickening of the mitral valve leaflet(s). Trivial mitral valve regurgitation. MV peak gradient, 4.9 mmHg. The mean mitral valve gradient is 2.0 mmHg. Tricuspid Valve: The tricuspid valve is grossly normal. Tricuspid valve regurgitation is mild. Aortic Valve: The aortic valve is bicuspid. Aortic valve regurgitation is trivial. Mild aortic valve sclerosis is present, with no evidence of aortic valve stenosis. Aortic valve mean gradient measures 3.0 mmHg. Aortic valve peak gradient  measures 6.6 mmHg. Aortic valve area, by VTI measures 2.54 cm. Pulmonic Valve: The pulmonic valve was normal in structure. Pulmonic valve regurgitation is not visualized. Aorta: The aortic root and ascending aorta are structurally normal, with no evidence of dilitation. Venous: The inferior vena cava is normal in size with less than 50% respiratory variability, suggesting right atrial pressure of 8 mmHg. IAS/Shunts: No atrial level shunt detected by color flow Doppler.  LEFT VENTRICLE PLAX 2D LVIDd:         3.50 cm  Diastology LVIDs:         2.30 cm  LV e' lateral:   8.17 cm/s LV PW:         1.20 cm  LV E/e' lateral: 11.9 LV IVS:        1.20 cm  LV e' medial:    5.82 cm/s LVOT diam:     1.80 cm  LV E/e' medial:  16.7 LV SV:         64 LV SV Index:   37 LVOT Area:     2.54 cm  RIGHT VENTRICLE             IVC RV S prime:     11.70 cm/s  IVC diam: 1.40 cm TAPSE (M-mode): 2.0 cm LEFT ATRIUM             Index       RIGHT ATRIUM           Index LA diam:        3.50 cm 2.03 cm/m  RA Area:     10.00 cm LA Vol (A2C):   34.4 ml 19.92 ml/m RA Volume:   16.10 ml  9.32 ml/m LA Vol (A4C):   35.6 ml 20.61 ml/m LA Biplane Vol: 35.3 ml  20.44 ml/m  AORTIC VALVE AV Area (Vmax):    2.17 cm AV Area (Vmean):   2.15 cm AV Area (VTI):     2.54 cm AV Vmax:           128.00 cm/s AV Vmean:          83.600 cm/s AV VTI:            0.252 m AV Peak Grad:      6.6 mmHg AV Mean Grad:      3.0 mmHg LVOT Vmax:         109.00 cm/s LVOT Vmean:        70.500 cm/s LVOT VTI:          0.252 m LVOT/AV VTI ratio: 1.00  AORTA Ao Root diam: 2.90 cm MITRAL VALVE                TRICUSPID VALVE MV Area (PHT): 5.38 cm     TR Peak grad:   28.3 mmHg MV Peak grad:  4.9 mmHg     TR Vmax:        266.00 cm/s MV Mean grad:  2.0 mmHg MV Vmax:       1.11 m/s     SHUNTS MV Vmean:      62.4 cm/s    Systemic VTI:  0.25 m MV Decel Time: 141 msec     Systemic Diam: 1.80 cm MV E velocity: 97.40 cm/s MV A velocity: 106.00 cm/s MV E/A ratio:  0.92 Lyman Bishop MD Electronically signed by Lyman Bishop MD Signature Date/Time: 09/05/2019/5:26:17 PM    Final    US THYROID  Result Date: 09/05/2019 CLINICAL DATA:  Multinodular goiter  by cervical spine CT EXAM: THYROID ULTRASOUND TECHNIQUE: Ultrasound examination of the thyroid gland and adjacent soft tissues was performed. COMPARISON:  09/04/2019 FINDINGS: Parenchymal Echotexture: Moderately heterogenous Isthmus: 3 mm Right lobe: 4.8 x 1.9 x 2.7 cm Left lobe: 5.7 x 2.9 x 2.3 cm _________________________________________________________ Estimated total number of nodules >/= 1 cm: 5 Number of spongiform nodules >/=  2 cm not described below (TR1): 0 Number of mixed cystic and solid nodules >/= 1.5 cm not described below (TR2): 0 _________________________________________________________ Nodule # 1: Location: Right; Superior Maximum size: 2.6 cm; Other 2 dimensions: 1.5 x 1.3 cm Composition: solid/almost completely solid (2) Echogenicity: hyperechoic (1) Shape: not taller-than-wide (0) Margins: ill-defined (0) Echogenic foci: macrocalcifications (1) ACR TI-RADS total points: 4. ACR TI-RADS risk category: TR4 (4-6 points). ACR TI-RADS  recommendations: **Given size (>/= 1.5 cm) and appearance, fine needle aspiration of this moderately suspicious nodule should be considered based on TI-RADS criteria. _________________________________________________________ Nodule # 2: Location: Right; Mid Maximum size: 1.4 cm; Other 2 dimensions: 1.2 x 0.6 cm Composition: solid/almost completely solid (2) Echogenicity: hypoechoic (2) Shape: not taller-than-wide (0) Margins: ill-defined (0) Echogenic foci: none (0) ACR TI-RADS total points: 4. ACR TI-RADS risk category: TR4 (4-6 points). ACR TI-RADS recommendations: *Given size (>/= 1 - 1.4 cm) and appearance, a follow-up ultrasound in 1 year should be considered based on TI-RADS criteria. _________________________________________________________ Nodule # 4: Location: Left; Superior Maximum size: 1.2 cm; Other 2 dimensions: 1.1 x 0.9 cm Composition: solid/almost completely solid (2) Echogenicity: hypoechoic (2) Shape: not taller-than-wide (0) Margins: smooth (0) Echogenic foci: none (0) ACR TI-RADS total points: 4. ACR TI-RADS risk category: TR4 (4-6 points). ACR TI-RADS recommendations: *Given size (>/= 1 - 1.4 cm) and appearance, a follow-up ultrasound in 1 year should be considered based on TI-RADS criteria. _________________________________________________________ Nodule # 5: Location: Left; Mid Maximum size: 2.5 cm; Other 2 dimensions: 2.3 x 1.4 cm Composition: solid/almost completely solid (2) Echogenicity: hypoechoic (2) Shape: not taller-than-wide (0) Margins: ill-defined (0) Echogenic foci: none (0) ACR TI-RADS total points: 4. ACR TI-RADS risk category: TR4 (4-6 points). ACR TI-RADS recommendations: **Given size (>/= 1.5 cm) and appearance, fine needle aspiration of this moderately suspicious nodule should be considered based on TI-RADS criteria. _________________________________________________________ Moderate thyroid heterogeneity. Additional bilateral mixed cystic/solid nodules noted all measuring 1.3  cm or less in size. These would not meet criteria for any biopsy or follow-up and are not fully described by TI rads criteria. No hypervascularity. Scattered dystrophic shadowing calcifications in the thyroid. No regional adenopathy. IMPRESSION: 2.6 cm right superior TR 4 nodule and 2.5 cm left mid thyroid TR 4 nodule. Both meet criteria for biopsy as above. (Nodules 1 and 5). Additional nodules meet criteria follow-up in 1 year as above. The above is in keeping with the ACR TI-RADS recommendations - J Am Coll Radiol 2017;14:587-595. Electronically Signed   By: Jerilynn Mages.  Shick M.D.   On: 09/05/2019 09:42    Discharge Exam: BP (!) 133/72 (BP Location: Right Arm)   Pulse 72   Temp 98 F (36.7 C) (Oral)   Resp 20   Ht _0  (1.549 m)   Wt 75 kg   SpO2 100%   BMI 31.23 kg/m  General appearance: alert, cooperative and no distress Head: Scalp laceration appreciated on upper posterior scalp with 2 staples in place and surrounding dried blood, no obvious swelling or fluctuance Eyes: EOMI Lungs: clear to auscultation bilaterally Heart: regular rate and rhythm and S1, S2 normal Abdomen: normal findings: bowel sounds  normal and soft, non-tender Extremities: No edema Skin: mobility and turgor normal Neurologic: Grossly normal    The results of significant diagnostics from this hospitalization (including imaging, microbiology, ancillary and laboratory) are listed below for reference.     Microbiology: Recent Results (from the past 240 hour(s))  SARS Coronavirus 2 by RT PCR (hospital order, performed in [facility] hospital lab) Nasopharyngeal Nasopharyngeal Swab     Status: None   Collection Time: 09/04/19  9:48 PM   Specimen: Nasopharyngeal Swab  Result Value Ref Range Status   SARS Coronavirus 2 NEGATIVE NEGATIVE Final    Comment: (NOTE) SARS-CoV-2 target nucleic acids are NOT DETECTED.  The SARS-CoV-2 RNA is generally detectable in upper and lower respiratory specimens during the acute phase of  infection. The lowest concentration of SARS-CoV-2 viral copies this assay can detect is 250 copies / mL. A negative result does not preclude SARS-CoV-2 infection and should not be used as the sole basis for treatment or other patient management decisions.  A negative result may occur with improper specimen collection / handling, submission of specimen other than nasopharyngeal swab, presence of viral mutation(s) within the areas targeted by this assay, and inadequate number of viral copies (<250 copies / mL). A negative result must be combined with clinical observations, patient history, and epidemiological information.  Fact Sheet for Patients:   StrictlyIdeas.no  Fact Sheet for Healthcare Providers: BankingDealers.co.za  This test is not yet approved or  cleared by the [place] FDA and has been authorized for detection and/or diagnosis of SARS-CoV-2 by FDA under an Emergency Use Authorization (EUA).  This EUA will remain in effect (meaning this test can be used) for the duration of the COVID-19 declaration under Section 564(b)(1) of the Act, 21 U.S.C. section 360bbb-3(b)(1), unless the authorization is terminated or revoked sooner.  Performed at [facility], [place] [address] [place], [place] [address]      Labs: BNP (last 3 results) No results for input(s): BNP in the last 8760 hours. Basic Metabolic Panel: Recent Labs  Lab 09/04/19 2020 09/05/19 0410 09/06/19 0610  NA 139 140 142  K 3.6 3.4* 3.4*  CL 103 104 105  CO2 _0 GLUCOSE 101* 109* 104*  BUN _1 CREATININE 0.79 0.72 0.75  CALCIUM 9.4 9.2 9.1  MG  --   --  1.9   Liver Function Tests: Recent Labs  Lab 09/04/19 2020  AST 23  ALT 18  ALKPHOS 58  BILITOT 0.5  PROT 7.3  ALBUMIN 4.2   No results for input(s): LIPASE, AMYLASE in the last 168 hours. No results for input(s): AMMONIA in the last 168 hours. CBC: Recent Labs  Lab  09/04/19 2300 09/05/19 0410 09/05/19 1635 09/06/19 0610  WBC 8.7 8.8  --  7.3  NEUTROABS  --   --   --  3.9  HGB 11.1* 10.3* 10.6* 10.8*  HCT 34.8* 32.3* 32.8* 33.8*  MCV 95.6 93.9  --  94.7  PLT 262 247  --  257   Cardiac Enzymes: No results for input(s): CKTOTAL, CKMB, CKMBINDEX, TROPONINI in the last 168 hours. BNP: Invalid input(s): POCBNP CBG: Recent Labs  Lab 09/04/19 2020 09/05/19 0507 09/06/19 0534  GLUCAP 85 90 106*   D-Dimer No results for input(s): DDIMER in the last 72 hours. Hgb A1c No results for input(s): HGBA1C in the last 72 hours. Lipid Profile No results for input(s): CHOL, HDL, LDLCALC, TRIG, CHOLHDL, LDLDIRECT in the last 72  hours. Thyroid function studies Recent Labs    09/05/19 0140  TSH 1.977   Anemia work up Recent Labs    09/05/19 0819  VITAMINB12 433  FOLATE 34.0  FERRITIN 42  TIBC 358  IRON 44   Urinalysis    Component Value Date/Time   COLORURINE YELLOW 09/04/2019 2349   APPEARANCEUR CLEAR 09/04/2019 2349   LABSPEC 1.014 09/04/2019 2349   PHURINE 6.0 09/04/2019 2349   GLUCOSEU NEGATIVE 09/04/2019 2349   HGBUR NEGATIVE 09/04/2019 2349   BILIRUBINUR NEGATIVE 09/04/2019 2349   KETONESUR NEGATIVE 09/04/2019 2349   PROTEINUR NEGATIVE 09/04/2019 2349   NITRITE NEGATIVE 09/04/2019 2349   LEUKOCYTESUR NEGATIVE 09/04/2019 2349   Sepsis Labs Invalid input(s): PROCALCITONIN,  WBC,  LACTICIDVEN Microbiology Recent Results (from the past 240 hour(s))  SARS Coronavirus 2 by RT PCR (hospital order, performed in [facility] lab) Nasopharyngeal Nasopharyngeal Swab     Status: None   Collection Time: 09/04/19  9:48 PM   Specimen: Nasopharyngeal Swab  Result Value Ref Range Status   SARS Coronavirus 2 NEGATIVE NEGATIVE Final    Comment: (NOTE) SARS-CoV-2 target nucleic acids are NOT DETECTED.  The SARS-CoV-2 RNA is generally detectable in upper and lower respiratory specimens during the acute phase of infection. The  lowest concentration of SARS-CoV-2 viral copies this assay can detect is 250 copies / mL. A negative result does not preclude SARS-CoV-2 infection and should not be used as the sole basis for treatment or other patient management decisions.  A negative result may occur with improper specimen collection / handling, submission of specimen other than nasopharyngeal swab, presence of viral mutation(s) within the areas targeted by this assay, and inadequate number of viral copies (<250 copies / mL). A negative result must be combined with clinical observations, patient history, and epidemiological information.  Fact Sheet for Patients:   StrictlyIdeas.no  Fact Sheet for Healthcare Providers: BankingDealers.co.za  This test is not yet approved or  cleared by the [place] FDA and has been authorized for detection and/or diagnosis of SARS-CoV-2 by FDA under an Emergency Use Authorization (EUA).  This EUA will remain in effect (meaning this test can be used) for the duration of the COVID-19 declaration under Section 564(b)(1) of the Act, 21 U.S.C. section 360bbb-3(b)(1), unless the authorization is terminated or revoked sooner.  Performed at [facility], [place] [address] [place], [place] [address]     Time coordinating discharge: Over 10 minutes    Dwyane Dee, MD  [facility] 09/06/2019, 12:57 PM Pager: Secure chat  If 7PM-7AM, please contact night-coverage [email or web address] Password [email or web address]

## 2019-09-06 NOTE — Progress Notes (Signed)
Teli and IV removed. Patient dressed. AVS paperwork given. Patient is awaiting on her son to pick her up.

## 2019-10-15 ENCOUNTER — Other Ambulatory Visit: Payer: Self-pay | Admitting: Physician Assistant

## 2019-10-15 DIAGNOSIS — R5381 Other malaise: Secondary | ICD-10-CM

## 2019-10-15 DIAGNOSIS — Z1231 Encounter for screening mammogram for malignant neoplasm of breast: Secondary | ICD-10-CM

## 2019-10-16 ENCOUNTER — Other Ambulatory Visit: Payer: Self-pay | Admitting: Physician Assistant

## 2019-10-16 DIAGNOSIS — M810 Age-related osteoporosis without current pathological fracture: Secondary | ICD-10-CM

## 2020-01-22 ENCOUNTER — Other Ambulatory Visit: Payer: Medicare HMO

## 2020-01-22 ENCOUNTER — Ambulatory Visit: Payer: Medicare HMO

## 2021-09-13 IMAGING — CT CT HEAD W/O CM
3 series · 14 of 47 positions shown, 16 images · non-contrast
Comparison: None.

CLINICAL DATA: Head trauma. Struck posterior aspect of head on [employer] TV stand. Multiple falls over the last 3 days. Scalp
hematoma.

EXAM:
CT HEAD WITHOUT CONTRAST
CT CERVICAL SPINE WITHOUT CONTRAST
TECHNIQUE: Multidetector CT imaging of the head and cervical spine was
performed following the standard protocol without intravenous
contrast. Multiplanar CT image reconstructions of the cervical spine
were also generated.

[Series 3: head 5.0 h30s · axial · 0.44mm/px · z∈[-69,+71]mm · 8 of 34 slices shown, 10 images]
[im 3/34  brain]
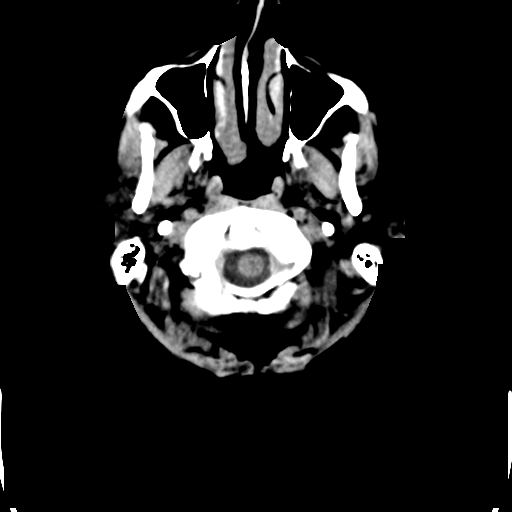
[im 3/34  bone]
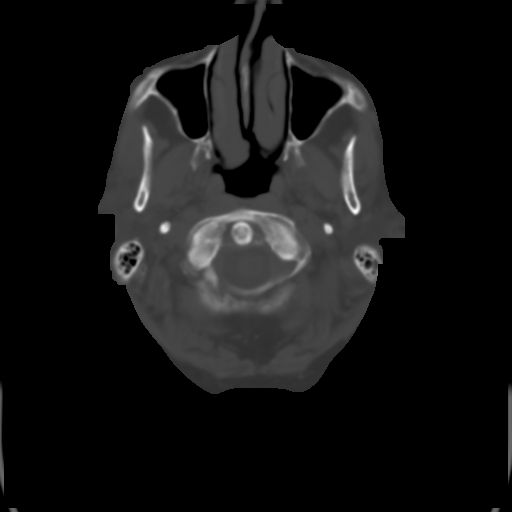
[im 7/34  brain]
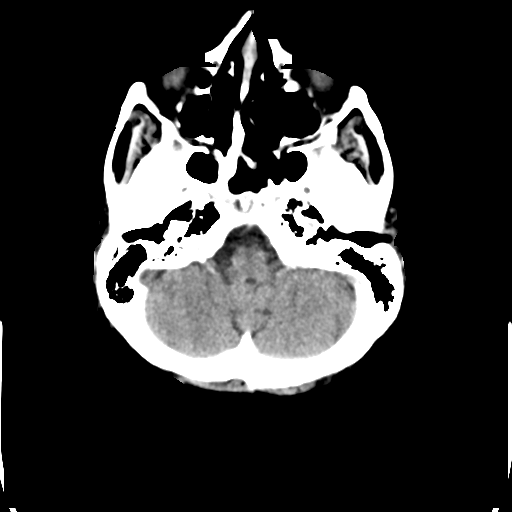
[im 11/34  brain]
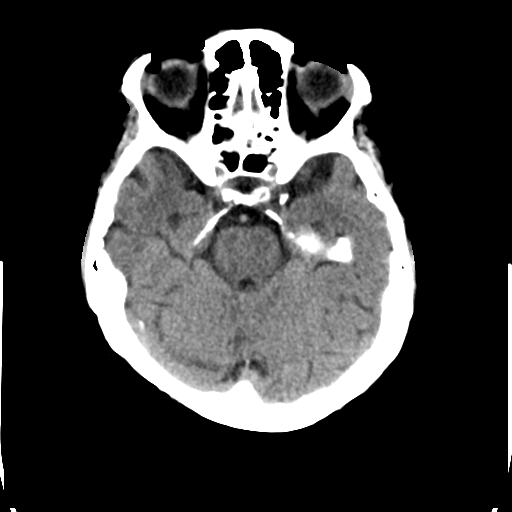
[im 15/34  brain]
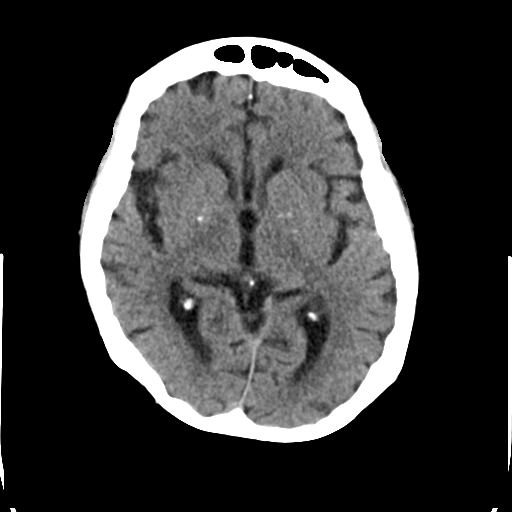
[im 19/34  brain]
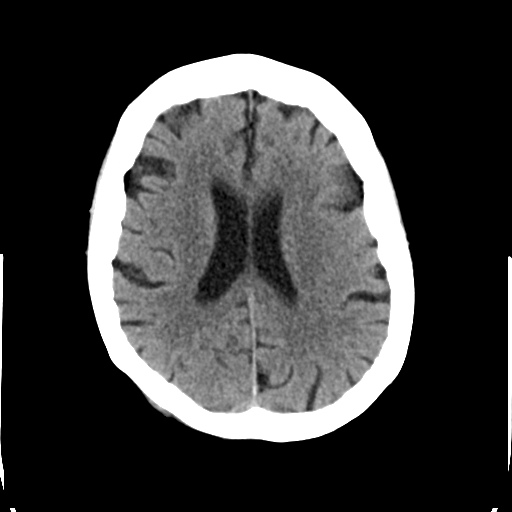
[im 19/34  bone]
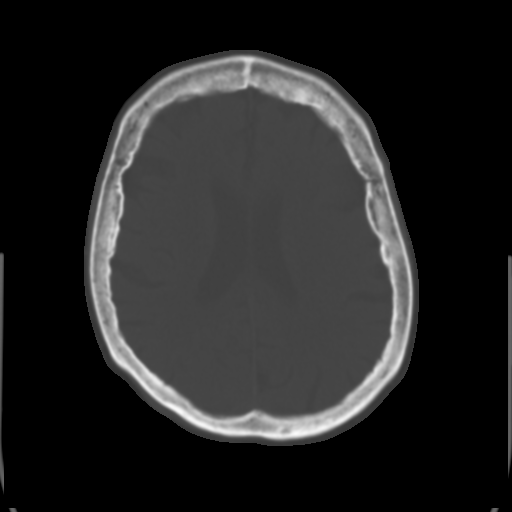
[im 23/34  brain]
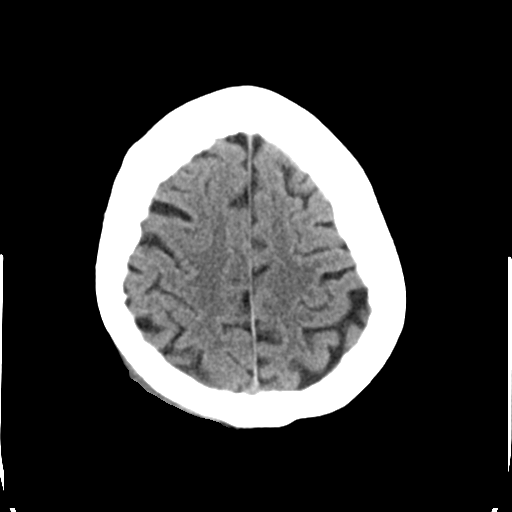
[im 27/34  brain]
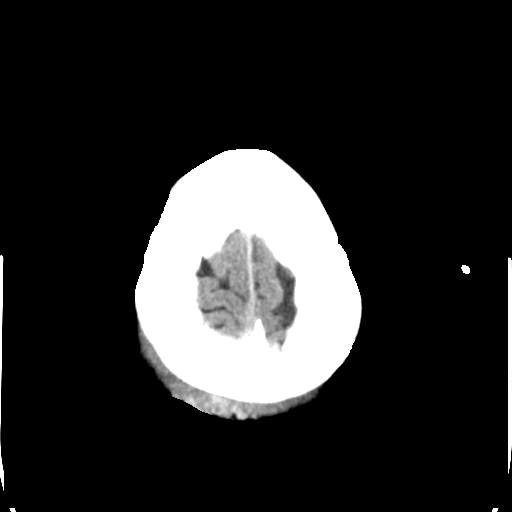
[im 31/34  brain]
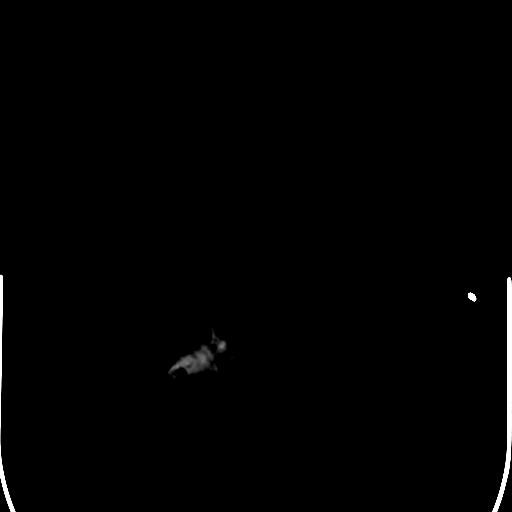

[Series 5: head 3.0 mpr cor · coronal · 0.31mm/px · 3 of 71 slices shown]
[im 24/71  brain]
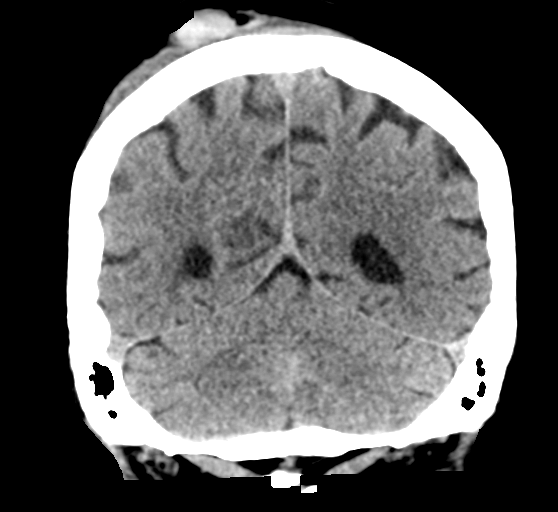
[im 32/71  brain]
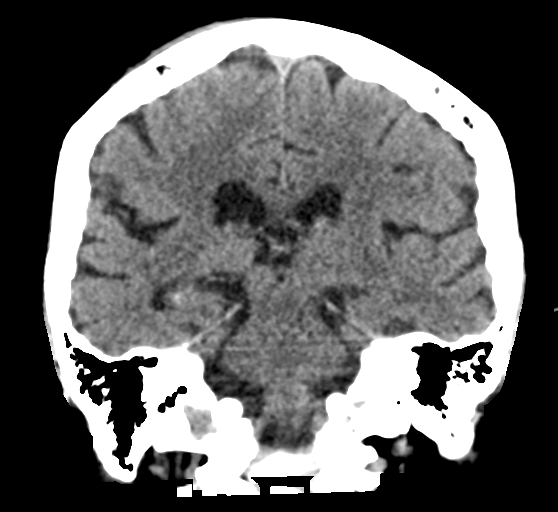
[im 39/71  brain]
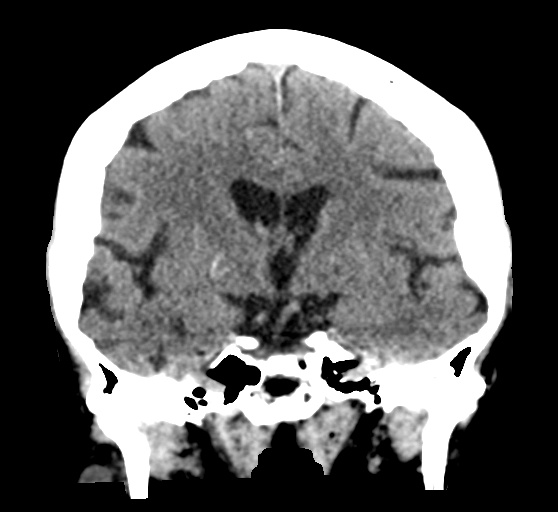

[Series 6: head 3.0 mpr sag · sagittal · 0.33mm/px · 3 of 58 slices shown]
[im 20/58  brain]
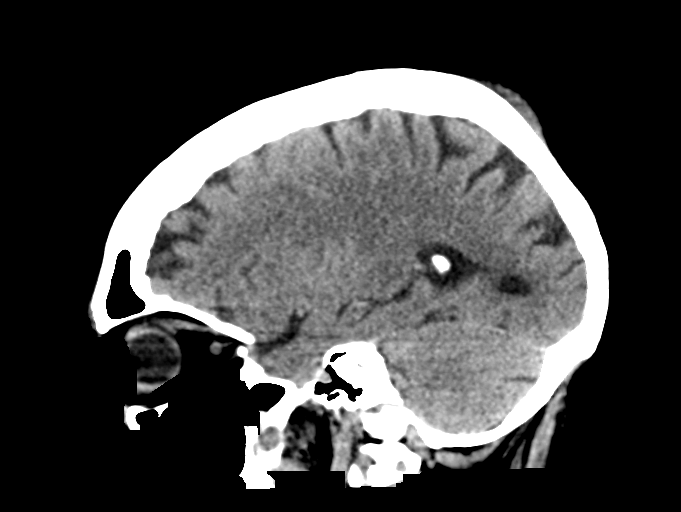
[im 29/58  brain]
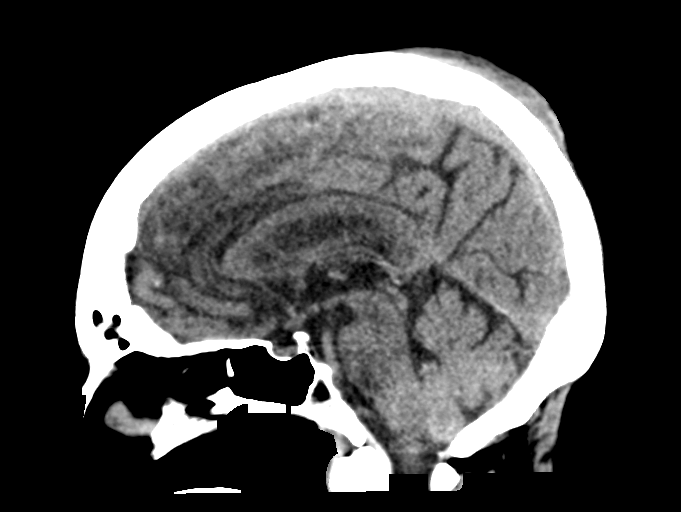
[im 39/58  brain]
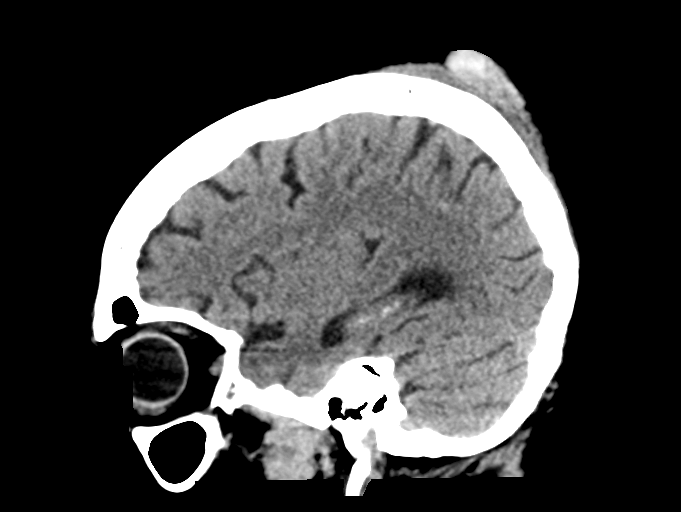

[14 of 47 positions shown; findings below may reference images not displayed]

FINDINGS: CT HEAD FINDINGS

Brain: No acute infarct, hemorrhage, or mass lesion is present. Mild
atrophy and white matter changes are present. The ventricles are of
normal size. No significant extraaxial fluid collection is present.
The brainstem and cerebellum are within normal limits.

Vascular: Atherosclerotic changes are present in the cavernous
internal carotid arteries bilaterally. No hyperdense vessel is
present.

Skull: Right paramedian scalp laceration and hematoma is present
near the vertex. No underlying fracture is present.

Sinuses/Orbits: The paranasal sinuses and mastoid air cells are
clear. Bilateral lens replacements are noted. Globes and orbits are
otherwise unremarkable.

CT CERVICAL SPINE FINDINGS

Alignment: No significant listhesis is present. Cervical lordosis
preserved.

Skull base and vertebrae: Craniocervical junction is within normal
limits. Vertebral body heights are normal. No acute or healing
fractures are present.

Soft tissues and spinal canal: A multinodular goiter of the thyroid
is present. No dominant nodule is present. No significant adenopathy
is present.

Disc levels: Multilevel degenerative changes are present. Moderate
right foraminal stenosis is present at C4-5 due to facet and
uncovertebral spurring.

Upper chest: Lung apices are clear. Thoracic inlet is within normal
limits.
IMPRESSION: 1. Right paramedian scalp laceration and hematoma near the vertex
without underlying fracture.
2. Normal CT appearance of the brain for age. No acute intracranial
abnormality.
3. No acute fracture or traumatic subluxation in the cervical spine.
4. Multilevel degenerative changes of the cervical spine as
described.
5. Multinodular goiter. Recommend thyroid ultrasound (ref: [HOSPITAL]. [DATE]): 143-50).

## 2021-09-13 IMAGING — CR DG CHEST 2V
2 series · 2 of 2 positions shown · non-contrast
Comparison: None.

CLINICAL DATA: Syncope.

EXAM:
CHEST - 2 VIEW

[chest lat]
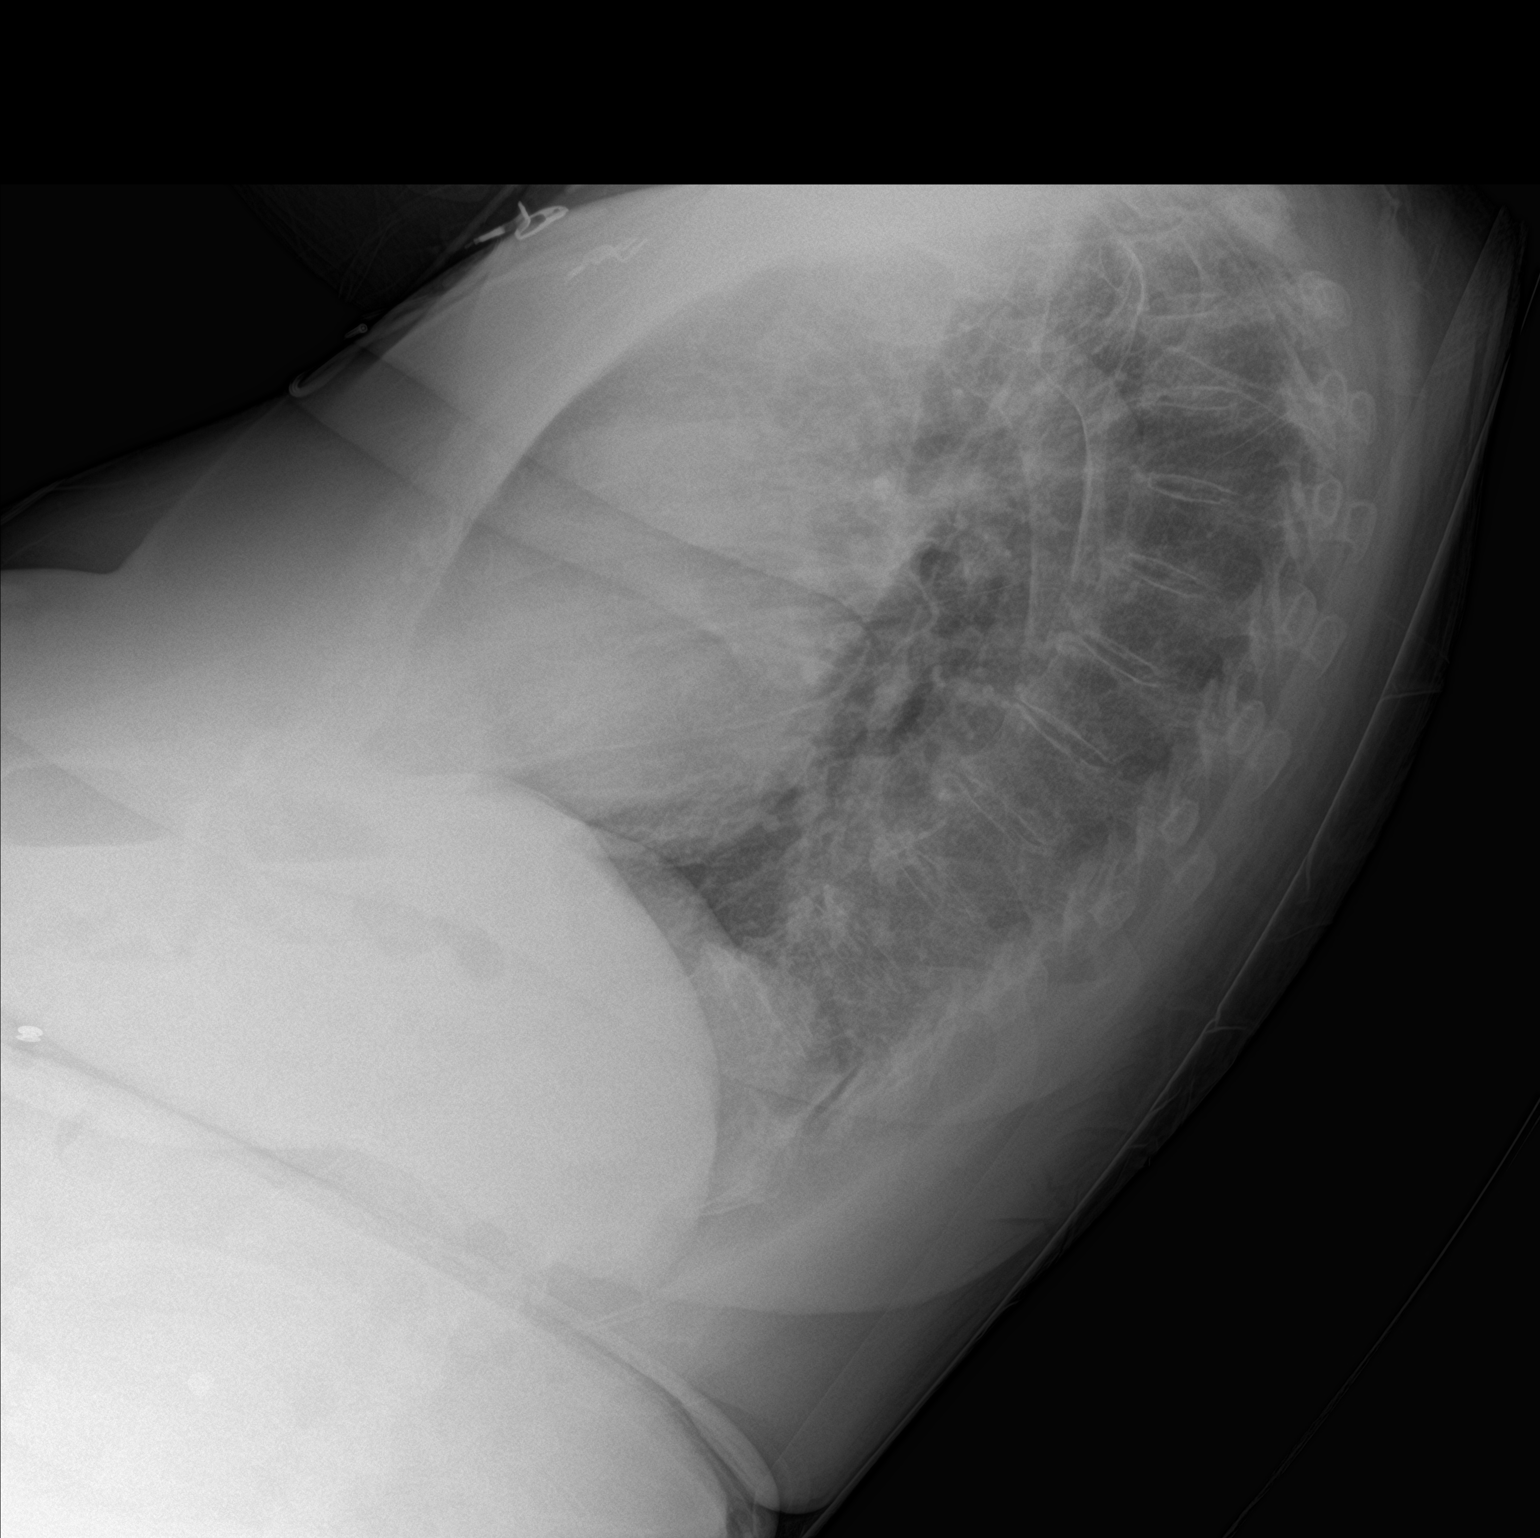

[chest ap]
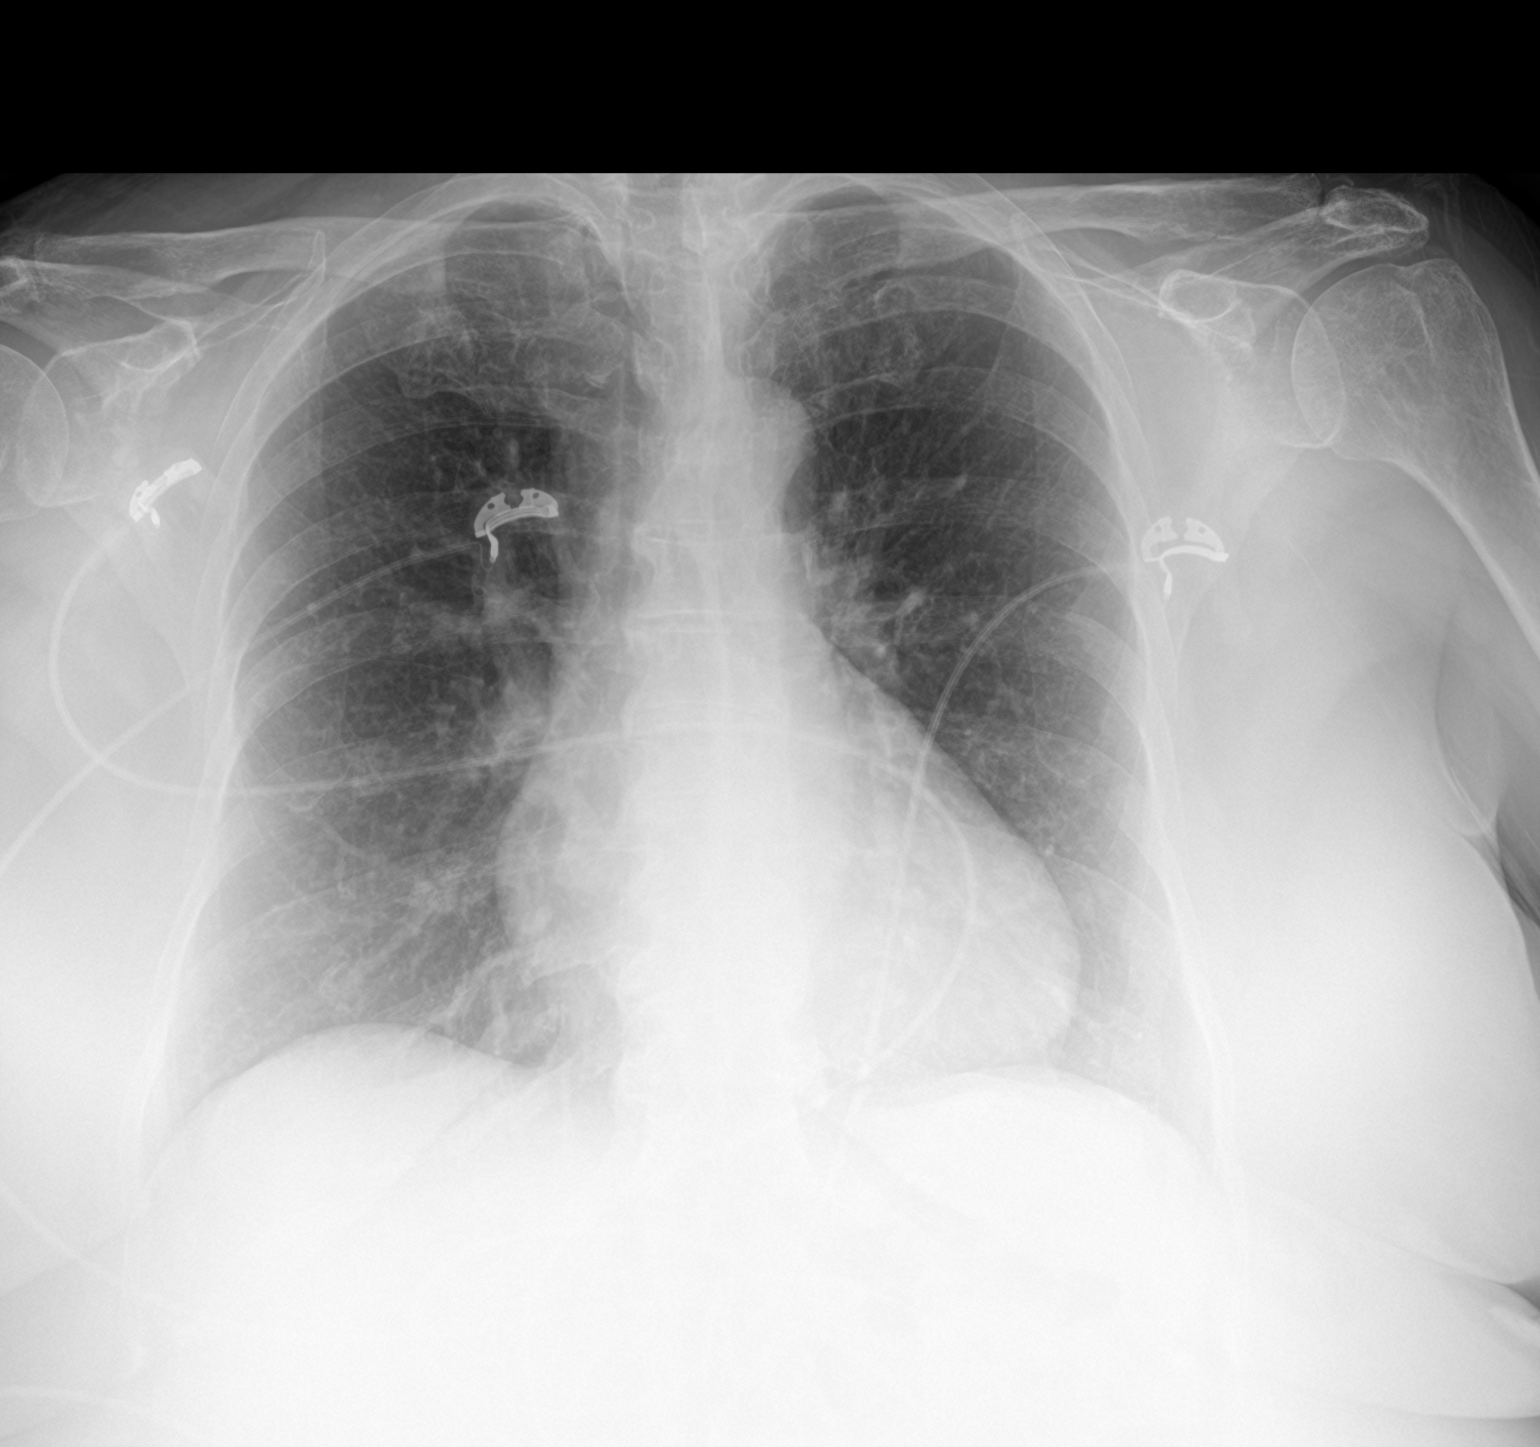

[2 of 2 positions shown; findings below may reference images not displayed]

FINDINGS: Very mild, diffuse chronic appearing increased lung markings are
seen without evidence of acute infiltrate, pleural effusion or
pneumothorax. The heart size and mediastinal contours are within
normal limits. Multilevel degenerative changes seen throughout the
thoracic spine.
IMPRESSION: No active cardiopulmonary disease.

## 2023-08-13 DEATH — deceased
